# Patient Record
Sex: Male | Born: 1935
Health system: Southern US, Community
[De-identification: ages and names within clinical notes are randomized; demographics above are authoritative.]

## PROBLEM LIST (undated history)

## (undated) DIAGNOSIS — C61 Malignant neoplasm of prostate: Secondary | ICD-10-CM

## (undated) DIAGNOSIS — R0600 Dyspnea, unspecified: Secondary | ICD-10-CM

## (undated) DIAGNOSIS — L309 Dermatitis, unspecified: Secondary | ICD-10-CM

## (undated) DIAGNOSIS — Z9889 Other specified postprocedural states: Secondary | ICD-10-CM

## (undated) DIAGNOSIS — K219 Gastro-esophageal reflux disease without esophagitis: Secondary | ICD-10-CM

## (undated) DIAGNOSIS — R338 Other retention of urine: Secondary | ICD-10-CM

## (undated) DIAGNOSIS — N401 Enlarged prostate with lower urinary tract symptoms: Secondary | ICD-10-CM

## (undated) DIAGNOSIS — Z974 Presence of external hearing-aid: Secondary | ICD-10-CM

## (undated) DIAGNOSIS — J45909 Unspecified asthma, uncomplicated: Secondary | ICD-10-CM

## (undated) DIAGNOSIS — Z860101 Personal history of adenomatous and serrated colon polyps: Secondary | ICD-10-CM

## (undated) DIAGNOSIS — Z978 Presence of other specified devices: Secondary | ICD-10-CM

## (undated) DIAGNOSIS — E785 Hyperlipidemia, unspecified: Secondary | ICD-10-CM

## (undated) DIAGNOSIS — C189 Malignant neoplasm of colon, unspecified: Secondary | ICD-10-CM

## (undated) DIAGNOSIS — Z86010 Personal history of colonic polyps: Secondary | ICD-10-CM

## (undated) DIAGNOSIS — Z85820 Personal history of malignant melanoma of skin: Secondary | ICD-10-CM

## (undated) DIAGNOSIS — J449 Chronic obstructive pulmonary disease, unspecified: Secondary | ICD-10-CM

## (undated) DIAGNOSIS — K5730 Diverticulosis of large intestine without perforation or abscess without bleeding: Secondary | ICD-10-CM

## (undated) DIAGNOSIS — C4490 Unspecified malignant neoplasm of skin, unspecified: Secondary | ICD-10-CM

## (undated) DIAGNOSIS — Z960 Presence of urogenital implants: Secondary | ICD-10-CM

## (undated) DIAGNOSIS — Z85048 Personal history of other malignant neoplasm of rectum, rectosigmoid junction, and anus: Secondary | ICD-10-CM

## (undated) DIAGNOSIS — Z87442 Personal history of urinary calculi: Secondary | ICD-10-CM

## (undated) HISTORY — PX: COLONOSCOPY: SHX174

## (undated) HISTORY — DX: Unspecified asthma, uncomplicated: J45.909

## (undated) HISTORY — PX: EYE SURGERY: SHX253

---

## 2003-10-10 ENCOUNTER — Emergency Department (HOSPITAL_COMMUNITY): Admission: EM | Admit: 2003-10-10 | Discharge: 2003-10-10 | Payer: Self-pay | Admitting: Emergency Medicine

## 2003-11-15 ENCOUNTER — Ambulatory Visit: Payer: Self-pay | Admitting: Internal Medicine

## 2003-11-22 ENCOUNTER — Ambulatory Visit: Payer: Self-pay | Admitting: Internal Medicine

## 2003-11-28 ENCOUNTER — Ambulatory Visit: Payer: Self-pay | Admitting: Internal Medicine

## 2003-12-21 ENCOUNTER — Ambulatory Visit (HOSPITAL_COMMUNITY): Admission: RE | Admit: 2003-12-21 | Discharge: 2003-12-21 | Payer: Self-pay | Admitting: General Surgery

## 2003-12-23 ENCOUNTER — Ambulatory Visit: Payer: Self-pay | Admitting: Oncology

## 2004-01-05 ENCOUNTER — Ambulatory Visit (HOSPITAL_COMMUNITY): Admission: RE | Admit: 2004-01-05 | Discharge: 2004-01-05 | Payer: Self-pay | Admitting: Hematology and Oncology

## 2004-01-06 ENCOUNTER — Ambulatory Visit (HOSPITAL_COMMUNITY): Admission: RE | Admit: 2004-01-06 | Discharge: 2004-01-06 | Payer: Self-pay | Admitting: Hematology and Oncology

## 2004-01-06 ENCOUNTER — Encounter (INDEPENDENT_AMBULATORY_CARE_PROVIDER_SITE_OTHER): Payer: Self-pay | Admitting: *Deleted

## 2004-01-24 ENCOUNTER — Encounter (INDEPENDENT_AMBULATORY_CARE_PROVIDER_SITE_OTHER): Payer: Self-pay | Admitting: Specialist

## 2004-01-24 ENCOUNTER — Observation Stay (HOSPITAL_COMMUNITY): Admission: RE | Admit: 2004-01-24 | Discharge: 2004-01-25 | Payer: Self-pay | Admitting: General Surgery

## 2004-01-24 HISTORY — PX: OTHER SURGICAL HISTORY: SHX169

## 2004-02-13 ENCOUNTER — Ambulatory Visit: Payer: Self-pay | Admitting: Oncology

## 2004-04-26 ENCOUNTER — Ambulatory Visit (HOSPITAL_COMMUNITY): Admission: RE | Admit: 2004-04-26 | Discharge: 2004-04-26 | Payer: Self-pay | Admitting: Oncology

## 2004-05-02 ENCOUNTER — Ambulatory Visit: Payer: Self-pay | Admitting: Oncology

## 2004-06-15 ENCOUNTER — Ambulatory Visit: Payer: Self-pay | Admitting: Internal Medicine

## 2004-07-03 ENCOUNTER — Encounter (INDEPENDENT_AMBULATORY_CARE_PROVIDER_SITE_OTHER): Payer: Self-pay | Admitting: Specialist

## 2004-07-03 ENCOUNTER — Ambulatory Visit: Payer: Self-pay | Admitting: Internal Medicine

## 2004-10-26 ENCOUNTER — Emergency Department (HOSPITAL_COMMUNITY): Admission: EM | Admit: 2004-10-26 | Discharge: 2004-10-27 | Payer: Self-pay | Admitting: Emergency Medicine

## 2005-10-31 ENCOUNTER — Ambulatory Visit: Payer: Self-pay | Admitting: Internal Medicine

## 2005-11-14 ENCOUNTER — Ambulatory Visit: Payer: Self-pay | Admitting: Internal Medicine

## 2005-11-14 ENCOUNTER — Encounter (INDEPENDENT_AMBULATORY_CARE_PROVIDER_SITE_OTHER): Payer: Self-pay | Admitting: *Deleted

## 2008-10-20 ENCOUNTER — Encounter (INDEPENDENT_AMBULATORY_CARE_PROVIDER_SITE_OTHER): Payer: Self-pay | Admitting: *Deleted

## 2009-05-15 ENCOUNTER — Telehealth: Payer: Self-pay | Admitting: Internal Medicine

## 2009-06-09 ENCOUNTER — Encounter (INDEPENDENT_AMBULATORY_CARE_PROVIDER_SITE_OTHER): Payer: Self-pay | Admitting: *Deleted

## 2009-06-15 ENCOUNTER — Encounter (INDEPENDENT_AMBULATORY_CARE_PROVIDER_SITE_OTHER): Payer: Self-pay

## 2009-06-20 ENCOUNTER — Ambulatory Visit: Payer: Self-pay | Admitting: Internal Medicine

## 2009-07-06 ENCOUNTER — Ambulatory Visit: Payer: Self-pay | Admitting: Internal Medicine

## 2010-01-27 ENCOUNTER — Encounter: Payer: Self-pay | Admitting: General Surgery

## 2010-02-08 NOTE — Miscellaneous (Signed)
Summary: Lec previsit  Clinical Lists Changes  Medications: Added new medication of MOVIPREP 100 GM  SOLR (PEG-KCL-NACL-NASULF-NA ASC-C) As per prep instructions. - Signed Rx of MOVIPREP 100 GM  SOLR (PEG-KCL-NACL-NASULF-NA ASC-C) As per prep instructions.;  #1 x 0;  Signed;  Entered by: [name] RN;  Authorized by: Hilarie Fredrickson MD;  Method used: Electronically to [facility]*, [address] [place], [place]  [id number], Ph: [phone], Fax: [phone] Allergies: Added new allergy or adverse reaction of * SULFUR Observations: Added new observation of NKA: F (06/20/2009 9:56)    Prescriptions: MOVIPREP 100 GM  SOLR (PEG-KCL-NACL-NASULF-NA ASC-C) As per prep instructions.  #1 x 0   Entered by:   [name] RN   Authorized by:   Hilarie Fredrickson MD   Signed by:   [name] RN on 06/20/2009   Method used:   Electronically to        [facility]* (retail)       [address]       [place], [place]  [id number]       Ph: [phone]       Fax: [phone]   RxID:   [id number]

## 2010-02-08 NOTE — Progress Notes (Signed)
Summary: Schedule Colonoscopy  Phone Note Outgoing Call Call back at Home Phone [phone]   Call placed by: Harlow Mares CMA Duncan Dull),  May 15, 2009 4:18 PM Call placed to: Patient Summary of Call: left message with patients wife, she will have him call back to schedule his colonoscopy. Initial call taken by: Harlow Mares CMA Duncan Dull),  May 15, 2009 4:19 PM  Follow-up for Phone Call        patient scheduled for 07/06/2009 colonoscopy Follow-up by: Harlow Mares CMA (AAMA),  June 08, 2009 3:05 PM

## 2010-02-08 NOTE — Letter (Signed)
Summary: [facility] Instructions  [facility]  [address] [place], [place] [address]   Phone: [phone]  Fax: [phone]       Charles Mitchell    August 16, 1931    MRN: [id number]        Procedure Day Charles Mitchell:  Charles Mitchell  07/06/09     Arrival Time:  8:00AM     Procedure Time:  9:00AM     Location of Procedure:                    Charles Mitchell _  [facility] (4th Floor)                       PREPARATION FOR COLONOSCOPY WITH MOVIPREP   Starting 5 days prior to your procedure 07/01/09 do not eat nuts, seeds, popcorn, corn, beans, peas,  salads, or any raw vegetables.  Do not take any fiber supplements (e.g. Metamucil, Citrucel, and Benefiber).  THE DAY BEFORE YOUR PROCEDURE         DATE: 07/05/09  DAY: WEDNESDAY  1.  Drink clear liquids the entire day-NO SOLID FOOD  2.  Do not drink anything colored red or purple.  Avoid juices with pulp.  No orange juice.  3.  Drink at least 64 oz. (8 glasses) of fluid/clear liquids during the day to prevent dehydration and help the prep work efficiently.  CLEAR LIQUIDS INCLUDE: Water Jello Ice Popsicles Tea (sugar ok, no milk/cream) Powdered fruit flavored drinks Coffee (sugar ok, no milk/cream) Gatorade Juice: apple, white grape, white cranberry  Lemonade Clear bullion, consomm, broth Carbonated beverages (any kind) Strained chicken noodle soup Hard Candy                             4.  In the morning, mix first dose of MoviPrep solution:    Empty 1 Pouch A and 1 Pouch B into the disposable container    Add lukewarm drinking water to the top line of the container. Mix to dissolve    Refrigerate (mixed solution should be used within 24 hrs)  5.  Begin drinking the prep at 5:00 p.m. The MoviPrep container is divided by 4 marks.   Every 15 minutes drink the solution down to the next mark (approximately 8 oz) until the full liter is complete.   6.  Follow completed prep with 16 oz of clear liquid of your choice  (Nothing red or purple).  Continue to drink clear liquids until bedtime.  7.  Before going to bed, mix second dose of MoviPrep solution:    Empty 1 Pouch A and 1 Pouch B into the disposable container    Add lukewarm drinking water to the top line of the container. Mix to dissolve    Refrigerate  THE DAY OF YOUR PROCEDURE      DATE: 07/06/09  DAY: THURSDAY  Beginning at 4:00AM (5 hours before procedure):         1. Every 15 minutes, drink the solution down to the next mark (approx 8 oz) until the full liter is complete.  2. Follow completed prep with 16 oz. of clear liquid of your choice.    3. You may drink clear liquids until 7:00AM (2 HOURS BEFORE PROCEDURE).   MEDICATION INSTRUCTIONS  Unless otherwise instructed, you should take regular prescription medications with a small sip of water   as early as possible the morning of  your procedure.         OTHER INSTRUCTIONS  You will need a responsible adult at least 75 years of age to accompany you and drive you home.   This person must remain in the waiting room during your procedure.  Wear loose fitting clothing that is easily removed.  Leave jewelry and other valuables at home.  However, you may wish to bring a book to read or  an iPod/MP3 player to listen to music as you wait for your procedure to start.  Remove all body piercing jewelry and leave at home.  Total time from sign-in until discharge is approximately 2-3 hours.  You should go home directly after your procedure and rest.  You can resume normal activities the  day after your procedure.  The day of your procedure you should not:   Drive   Make legal decisions   Operate machinery   Drink alcohol   Return to work  You will receive specific instructions about eating, activities and medications before you leave.    The above instructions have been reviewed and explained to me by   Charles Rias RN  June 20, 2009 10:14 AM     I fully understand  and can verbalize these instructions _____________________________ Date _________

## 2010-02-08 NOTE — Procedures (Signed)
Summary: Colonoscopy  Patient: Jemar Paulsen Note: All result statuses are Final unless otherwise noted.  Tests: (1) Colonoscopy (COL)   COL Colonoscopy           DONE     [facility]     [address]     [place], [place]  [address]           COLONOSCOPY PROCEDURE REPORT           PATIENT:  Janssen, Zee  MR#:  [id number]     BIRTHDATE:  1931/06/27, 77 yrs. old  GENDER:  male     ENDOSCOPIST:  Wilhemina Bonito. Eda Keys, MD     REF. BY:  Surveillance Program Recall,     PROCEDURE DATE:  07/06/2009     PROCEDURE:  Surveillance Colonoscopy, Higher-risk screening     colonoscopy G0105           ASA CLASS:  Class II     INDICATIONS:  history of colon cancer ; rectal Ca 2005; f/u 2006,     2007     MEDICATIONS:   Fentanyl 50 mcg IV, Versed 7 mg IV           DESCRIPTION OF PROCEDURE:   After the risks benefits and     alternatives of the procedure were thoroughly explained, informed     consent was obtained.  Digital rectal exam was performed and     revealed no abnormalities.   The [id number] [id number] endoscope was     introduced through the anus and advanced to the cecum, which was     identified by both the appendix and ileocecal valve, without     limitations.Time to cecum = 4:49 min. The quality of the prep was     excellent, using MoviPrep.  The instrument was then slowly     withdrawn (time = 10:05 min) as the colon was fully examined.     <<PROCEDUREIMAGES>>           FINDINGS:  Severe diverticulosis was found throughout the colon.     A 2cm lipoma was found in the proximal transverse colon.  This was     otherwise a normal examination of the colon.   Retroflexed views     in the rectum revealed no abnormalities (scar from prior excision     present) .    The scope was then withdrawn from the patient and     the procedure completed.           COMPLICATIONS:  None     ENDOSCOPIC IMPRESSION:     1) Severe diverticulosis throughout the colon     2) Lipoma in the proximal  transverse colon     3) Otherwise normal examination     RECOMMENDATIONS:     1) Follow up colonoscopy in 5 years           ______________________________     Wilhemina Bonito. Eda Keys, MD           CC:  Rodrigo Ran, MD;Todd Abbey Chatters, MD;Bradley Truett Perna, MD;The     Patient           n.     Rosalie DoctorWilhemina Bonito. Eda Keys at 07/06/2009 10:00 AM           Maylene Roes, [id number]  Note: An exclamation mark (!) indicates a result that was not dispersed into the flowsheet. Document Creation Date: 07/06/2009 10:01 AM _______________________________________________________________________  (1) Order  result status: Final Collection or observation date-time: 07/06/2009 09:51 Requested date-time:  Receipt date-time:  Reported date-time:  Referring Physician:   Ordering Physician: Fransico Setters [phone]) Specimen Source:  Source: Launa Grill Order Number: [id number] Lab site:   Appended Document: Colonoscopy    Clinical Lists Changes  Observations: Added new observation of COLONNXTDUE: 06/2014 (07/06/2009 11:16)

## 2010-02-08 NOTE — Letter (Signed)
Summary: Previsit letter  [facility]  [address] [place], [place] [address]   Phone: [phone]  Fax: [phone]       06/09/2009 MRN: [id number]  [facility] [address] [place], [place]  [address]  Dear Mr. DESIRE,  Welcome to the Gastroenterology Division at [facility].    You are scheduled to see a nurse for your pre-procedure visit on 06/20/2009 at 10:00AM on the 3rd floor at [facility], [address]  We ask that you try to arrive at our office 15 minutes prior to your appointment time to allow for check-in.  Your nurse visit will consist of discussing your medical and surgical history, your immediate family medical history, and your medications.    Please bring a complete list of all your medications or, if you prefer, bring the medication bottles and we will list them.  We will need to be aware of both prescribed and over the counter drugs.  We will need to know exact dosage information as well.  If you are on blood thinners (Coumadin, Plavix, Aggrenox, Ticlid, etc.) please call our office today/prior to your appointment, as we need to consult with your physician about holding your medication.   Please be prepared to read and sign documents such as consent forms, a financial agreement, and acknowledgement forms.  If necessary, and with your consent, a friend or relative is welcome to sit-in on the nurse visit with you.  Please bring your insurance card so that we may make a copy of it.  If your insurance requires a referral to see a specialist, please bring your referral form from your primary care physician.  No co-pay is required for this nurse visit.     If you cannot keep your appointment, please call [phone] to cancel or reschedule prior to your appointment date.  This allows [place] the opportunity to schedule an appointment for another patient in need of care.    Thank you for choosing [facility] for your medical  needs.  We appreciate the opportunity to care for you.  Please visit [place] at our website  to learn more about our practice.                     Sincerely.                                                                                                                   The Gastroenterology Division

## 2010-05-25 NOTE — Op Note (Signed)
NAME:  Charles, Mitchell NO.:  [id number]   MEDICAL RECORD NO.:  [id number]          PATIENT TYPE:  AMB   LOCATION:  DAY                          FACILITY:  [facility]   PHYSICIAN:  Adolph Pollack, M.D.DATE OF BIRTH:  Aug 19, 1931   DATE OF PROCEDURE:  01/24/2004  DATE OF DISCHARGE:                                 OPERATIVE REPORT   PREOPERATIVE DIAGNOSES:  1.  Rectal cancer.  2.  Hemorrhoids.   POSTOPERATIVE DIAGNOSES:  1.  Rectal cancer.  2.  Hemorrhoids.   PROCEDURE:  1.  Transrectal ultrasound.  2.  Transanal excision of rectal cancer.  3.  Hemorrhoidectomy (left lateral, right posterolateral).   SURGEON:  Adolph Pollack, M.D.   ANESTHESIA:  General.   INDICATIONS FOR PROCEDURE:  Mr. Prashant is a 77 year old male undergoing a  colonoscopy by Dr. Marina Goodell.  He had a 30-cm sessile rectal polyp removed in  piecemeal fashion.  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  I saw him in the office back in early  December.  A CT scan was ordered, and it suggested that he had metastatic  disease to the liver.  Referral was made to the [facility].  PET  scan actually was negative for showing these lesions to be hyperactive.  A  biopsy was performed which was consistent with hemangioma.  He now presents  for the above procedure.   DESCRIPTION OF PROCEDURE:  He was seen in the holding area and then brought  to the operating room, and a general anesthetic was administered.  He was  then placed prone on the operating table.   A jackknife position was ensued.  The buttocks were retracted with tape, and  two prolapsed internal hemorrhoids were noted in the right posterolateral  and left lateral position.  Following this, a digital rectal exam was  performed.  An irregularity in the right anterolateral position was noted,  consistent with what was  seen and found in the office.  Transrectal  ultrasound was then performed.  There did not appear to be any residual  lesion.  Based on the transrectal ultrasound, it appeared to be a mucosal-  based lesion, thus T1.  No lymphadenopathy or enlarged lymph nodes were  noted.  Thus, I decided to proceed with the transanal excision.   An anal retractor was used.  In the 4 and 5 position, that would be in the  right anterolateral, some gross abnormality of the area was identified.  This was consistent with the previous polypectomy site.  I then performed a  full-thickness excision from the 2 to the 6 o'clock region with  electrocautery.  I also obtained a proximal margin, full-thickness, and sent  these two specimens together, with the proximal margin being marked with a  suture.  Following this, I controlled some bleeding with the cautery.  I  then reapproximated most of the mucosa with interrupted Vicryl sutures.   The proctoscope was introduced, and the rectum was patent.  Hemostasis  appeared to be adequate at this time.   Following this, I approached the left lateral hemorrhoid and injected some  0.5% Marcaine with epinephrine.  The vascular pedicle was ligated with a 2-0  chromic suture, and the hemorrhoid was excised sharply.  The anoderm and  anal mucosa were approximated with running locking 2-0 chromic suture.   In a similar fashion, the right posterolateral hemorrhoid was approached and  local anesthetic injected.  The vascular pedicle was ligated with a 2-0  chromic suture.  The hemorrhoid was then excised sharply, and the anoderm  and anal mucosa were approximated with running locking 2-0 chromic suture.   I then inspected the rectal wound, and it was hemostatic.  A piece of  Gelfoam was placed in the rectal vault.  A bulky dressing was then applied.   The patient was returned supine, extubated, and taken to the recovery room  in satisfactory condition.  There were no apparent  complications.      TJR/MEDQ  D:  01/24/2004  T:  01/24/2004  Job:  [id number]   cc:   Loraine Leriche A. Waynard Edwards, M.D.  [address]  [place]  [place] [address]  Fax: [phone]   Wilhemina Bonito. Marina Goodell, M.D. [facility]   Jillyn Hidden B. Truett Perna, M.D.  [address] [facility]  [place]  [place] [address]  Fax: [phone]

## 2011-01-24 DIAGNOSIS — C44319 Basal cell carcinoma of skin of other parts of face: Secondary | ICD-10-CM | POA: Diagnosis not present

## 2011-02-27 DIAGNOSIS — L259 Unspecified contact dermatitis, unspecified cause: Secondary | ICD-10-CM | POA: Diagnosis not present

## 2011-04-15 DIAGNOSIS — R7301 Impaired fasting glucose: Secondary | ICD-10-CM | POA: Diagnosis not present

## 2011-04-15 DIAGNOSIS — Z87442 Personal history of urinary calculi: Secondary | ICD-10-CM | POA: Diagnosis not present

## 2011-04-15 DIAGNOSIS — E785 Hyperlipidemia, unspecified: Secondary | ICD-10-CM | POA: Diagnosis not present

## 2011-04-15 DIAGNOSIS — Z125 Encounter for screening for malignant neoplasm of prostate: Secondary | ICD-10-CM | POA: Diagnosis not present

## 2011-04-22 DIAGNOSIS — D7589 Other specified diseases of blood and blood-forming organs: Secondary | ICD-10-CM | POA: Diagnosis not present

## 2011-04-22 DIAGNOSIS — C20 Malignant neoplasm of rectum: Secondary | ICD-10-CM | POA: Diagnosis not present

## 2011-04-22 DIAGNOSIS — Z125 Encounter for screening for malignant neoplasm of prostate: Secondary | ICD-10-CM | POA: Diagnosis not present

## 2011-04-22 DIAGNOSIS — R7301 Impaired fasting glucose: Secondary | ICD-10-CM | POA: Diagnosis not present

## 2011-04-22 DIAGNOSIS — D510 Vitamin B12 deficiency anemia due to intrinsic factor deficiency: Secondary | ICD-10-CM | POA: Diagnosis not present

## 2011-04-22 DIAGNOSIS — E785 Hyperlipidemia, unspecified: Secondary | ICD-10-CM | POA: Diagnosis not present

## 2011-04-22 DIAGNOSIS — Z0000 Encounter for general adult medical examination without abnormal findings: Secondary | ICD-10-CM | POA: Diagnosis not present

## 2011-05-07 DIAGNOSIS — D510 Vitamin B12 deficiency anemia due to intrinsic factor deficiency: Secondary | ICD-10-CM | POA: Diagnosis not present

## 2011-05-14 DIAGNOSIS — D510 Vitamin B12 deficiency anemia due to intrinsic factor deficiency: Secondary | ICD-10-CM | POA: Diagnosis not present

## 2011-05-21 DIAGNOSIS — D510 Vitamin B12 deficiency anemia due to intrinsic factor deficiency: Secondary | ICD-10-CM | POA: Diagnosis not present

## 2011-06-20 DIAGNOSIS — D510 Vitamin B12 deficiency anemia due to intrinsic factor deficiency: Secondary | ICD-10-CM | POA: Diagnosis not present

## 2011-07-23 DIAGNOSIS — D510 Vitamin B12 deficiency anemia due to intrinsic factor deficiency: Secondary | ICD-10-CM | POA: Diagnosis not present

## 2011-08-27 DIAGNOSIS — D510 Vitamin B12 deficiency anemia due to intrinsic factor deficiency: Secondary | ICD-10-CM | POA: Diagnosis not present

## 2011-09-26 DIAGNOSIS — Z23 Encounter for immunization: Secondary | ICD-10-CM | POA: Diagnosis not present

## 2011-09-26 DIAGNOSIS — D510 Vitamin B12 deficiency anemia due to intrinsic factor deficiency: Secondary | ICD-10-CM | POA: Diagnosis not present

## 2011-10-14 DIAGNOSIS — Z23 Encounter for immunization: Secondary | ICD-10-CM | POA: Diagnosis not present

## 2012-02-18 DIAGNOSIS — D510 Vitamin B12 deficiency anemia due to intrinsic factor deficiency: Secondary | ICD-10-CM | POA: Diagnosis not present

## 2012-02-18 DIAGNOSIS — R05 Cough: Secondary | ICD-10-CM | POA: Diagnosis not present

## 2012-02-18 DIAGNOSIS — D539 Nutritional anemia, unspecified: Secondary | ICD-10-CM | POA: Diagnosis not present

## 2012-02-18 DIAGNOSIS — K219 Gastro-esophageal reflux disease without esophagitis: Secondary | ICD-10-CM | POA: Diagnosis not present

## 2012-02-25 DIAGNOSIS — K7689 Other specified diseases of liver: Secondary | ICD-10-CM | POA: Diagnosis not present

## 2012-02-25 DIAGNOSIS — R1013 Epigastric pain: Secondary | ICD-10-CM | POA: Diagnosis not present

## 2012-02-25 DIAGNOSIS — R109 Unspecified abdominal pain: Secondary | ICD-10-CM | POA: Diagnosis not present

## 2012-02-25 DIAGNOSIS — Q6101 Congenital single renal cyst: Secondary | ICD-10-CM | POA: Diagnosis not present

## 2012-02-26 ENCOUNTER — Other Ambulatory Visit: Payer: Self-pay | Admitting: Internal Medicine

## 2012-02-26 DIAGNOSIS — K769 Liver disease, unspecified: Secondary | ICD-10-CM

## 2012-02-26 DIAGNOSIS — N281 Cyst of kidney, acquired: Secondary | ICD-10-CM

## 2012-02-27 ENCOUNTER — Ambulatory Visit
Admission: RE | Admit: 2012-02-27 | Discharge: 2012-02-27 | Disposition: A | Discharge status: Home / Self Care | Payer: Medicare Other | Source: Ambulatory Visit | Attending: Internal Medicine | Admitting: Internal Medicine

## 2012-02-27 DIAGNOSIS — D1803 Hemangioma of intra-abdominal structures: Secondary | ICD-10-CM | POA: Diagnosis not present

## 2012-02-27 DIAGNOSIS — K769 Liver disease, unspecified: Secondary | ICD-10-CM

## 2012-02-27 DIAGNOSIS — K5730 Diverticulosis of large intestine without perforation or abscess without bleeding: Secondary | ICD-10-CM | POA: Diagnosis not present

## 2012-02-27 DIAGNOSIS — N281 Cyst of kidney, acquired: Secondary | ICD-10-CM | POA: Diagnosis not present

## 2012-02-27 MED ORDER — IOHEXOL 300 MG/ML  SOLN
100.0000 mL | Freq: Once | INTRAMUSCULAR | Status: AC | PRN
  Administered 2012-02-27: 100 mL via INTRAVENOUS

## 2012-06-12 DIAGNOSIS — R7301 Impaired fasting glucose: Secondary | ICD-10-CM | POA: Diagnosis not present

## 2012-06-12 DIAGNOSIS — E785 Hyperlipidemia, unspecified: Secondary | ICD-10-CM | POA: Diagnosis not present

## 2012-06-12 DIAGNOSIS — Z125 Encounter for screening for malignant neoplasm of prostate: Secondary | ICD-10-CM | POA: Diagnosis not present

## 2012-06-19 DIAGNOSIS — H902 Conductive hearing loss, unspecified: Secondary | ICD-10-CM | POA: Diagnosis not present

## 2012-06-19 DIAGNOSIS — Z125 Encounter for screening for malignant neoplasm of prostate: Secondary | ICD-10-CM | POA: Diagnosis not present

## 2012-06-19 DIAGNOSIS — E785 Hyperlipidemia, unspecified: Secondary | ICD-10-CM | POA: Diagnosis not present

## 2012-06-19 DIAGNOSIS — Z0000 Encounter for general adult medical examination without abnormal findings: Secondary | ICD-10-CM | POA: Diagnosis not present

## 2012-06-19 DIAGNOSIS — R7301 Impaired fasting glucose: Secondary | ICD-10-CM | POA: Diagnosis not present

## 2012-06-19 DIAGNOSIS — D510 Vitamin B12 deficiency anemia due to intrinsic factor deficiency: Secondary | ICD-10-CM | POA: Diagnosis not present

## 2012-06-19 DIAGNOSIS — K219 Gastro-esophageal reflux disease without esophagitis: Secondary | ICD-10-CM | POA: Diagnosis not present

## 2012-06-30 ENCOUNTER — Other Ambulatory Visit: Payer: Self-pay | Admitting: Dermatology

## 2012-06-30 DIAGNOSIS — L821 Other seborrheic keratosis: Secondary | ICD-10-CM | POA: Diagnosis not present

## 2012-06-30 DIAGNOSIS — C4359 Malignant melanoma of other part of trunk: Secondary | ICD-10-CM | POA: Diagnosis not present

## 2012-06-30 DIAGNOSIS — L819 Disorder of pigmentation, unspecified: Secondary | ICD-10-CM | POA: Diagnosis not present

## 2012-06-30 DIAGNOSIS — D485 Neoplasm of uncertain behavior of skin: Secondary | ICD-10-CM | POA: Diagnosis not present

## 2012-06-30 DIAGNOSIS — Z85828 Personal history of other malignant neoplasm of skin: Secondary | ICD-10-CM | POA: Diagnosis not present

## 2012-06-30 DIAGNOSIS — D239 Other benign neoplasm of skin, unspecified: Secondary | ICD-10-CM | POA: Diagnosis not present

## 2012-06-30 DIAGNOSIS — L259 Unspecified contact dermatitis, unspecified cause: Secondary | ICD-10-CM | POA: Diagnosis not present

## 2012-07-23 ENCOUNTER — Other Ambulatory Visit: Payer: Self-pay | Admitting: Dermatology

## 2012-07-23 DIAGNOSIS — C4359 Malignant melanoma of other part of trunk: Secondary | ICD-10-CM | POA: Diagnosis not present

## 2012-07-23 DIAGNOSIS — Z85828 Personal history of other malignant neoplasm of skin: Secondary | ICD-10-CM | POA: Diagnosis not present

## 2012-07-23 DIAGNOSIS — Z85820 Personal history of malignant melanoma of skin: Secondary | ICD-10-CM | POA: Diagnosis not present

## 2012-07-28 DIAGNOSIS — H903 Sensorineural hearing loss, bilateral: Secondary | ICD-10-CM | POA: Diagnosis not present

## 2012-08-13 DIAGNOSIS — H903 Sensorineural hearing loss, bilateral: Secondary | ICD-10-CM | POA: Diagnosis not present

## 2012-10-22 DIAGNOSIS — Z85828 Personal history of other malignant neoplasm of skin: Secondary | ICD-10-CM | POA: Diagnosis not present

## 2012-10-22 DIAGNOSIS — L819 Disorder of pigmentation, unspecified: Secondary | ICD-10-CM | POA: Diagnosis not present

## 2012-10-22 DIAGNOSIS — L821 Other seborrheic keratosis: Secondary | ICD-10-CM | POA: Diagnosis not present

## 2012-10-22 DIAGNOSIS — D239 Other benign neoplasm of skin, unspecified: Secondary | ICD-10-CM | POA: Diagnosis not present

## 2012-10-22 DIAGNOSIS — Z85820 Personal history of malignant melanoma of skin: Secondary | ICD-10-CM | POA: Diagnosis not present

## 2012-10-22 DIAGNOSIS — D1801 Hemangioma of skin and subcutaneous tissue: Secondary | ICD-10-CM | POA: Diagnosis not present

## 2013-01-21 DIAGNOSIS — D1801 Hemangioma of skin and subcutaneous tissue: Secondary | ICD-10-CM | POA: Diagnosis not present

## 2013-01-21 DIAGNOSIS — L259 Unspecified contact dermatitis, unspecified cause: Secondary | ICD-10-CM | POA: Diagnosis not present

## 2013-01-21 DIAGNOSIS — Z85820 Personal history of malignant melanoma of skin: Secondary | ICD-10-CM | POA: Diagnosis not present

## 2013-01-21 DIAGNOSIS — Z85828 Personal history of other malignant neoplasm of skin: Secondary | ICD-10-CM | POA: Diagnosis not present

## 2013-01-21 DIAGNOSIS — L821 Other seborrheic keratosis: Secondary | ICD-10-CM | POA: Diagnosis not present

## 2013-04-22 DIAGNOSIS — L821 Other seborrheic keratosis: Secondary | ICD-10-CM | POA: Diagnosis not present

## 2013-04-22 DIAGNOSIS — Z85828 Personal history of other malignant neoplasm of skin: Secondary | ICD-10-CM | POA: Diagnosis not present

## 2013-04-22 DIAGNOSIS — L259 Unspecified contact dermatitis, unspecified cause: Secondary | ICD-10-CM | POA: Diagnosis not present

## 2013-04-22 DIAGNOSIS — Z85820 Personal history of malignant melanoma of skin: Secondary | ICD-10-CM | POA: Diagnosis not present

## 2013-07-15 DIAGNOSIS — L259 Unspecified contact dermatitis, unspecified cause: Secondary | ICD-10-CM | POA: Diagnosis not present

## 2013-07-15 DIAGNOSIS — Z85828 Personal history of other malignant neoplasm of skin: Secondary | ICD-10-CM | POA: Diagnosis not present

## 2013-07-15 DIAGNOSIS — L84 Corns and callosities: Secondary | ICD-10-CM | POA: Diagnosis not present

## 2013-07-15 DIAGNOSIS — L2089 Other atopic dermatitis: Secondary | ICD-10-CM | POA: Diagnosis not present

## 2013-07-15 DIAGNOSIS — L821 Other seborrheic keratosis: Secondary | ICD-10-CM | POA: Diagnosis not present

## 2013-07-15 DIAGNOSIS — L819 Disorder of pigmentation, unspecified: Secondary | ICD-10-CM | POA: Diagnosis not present

## 2013-07-15 DIAGNOSIS — D1801 Hemangioma of skin and subcutaneous tissue: Secondary | ICD-10-CM | POA: Diagnosis not present

## 2013-07-15 DIAGNOSIS — Z85820 Personal history of malignant melanoma of skin: Secondary | ICD-10-CM | POA: Diagnosis not present

## 2013-07-26 DIAGNOSIS — E785 Hyperlipidemia, unspecified: Secondary | ICD-10-CM | POA: Diagnosis not present

## 2013-07-26 DIAGNOSIS — D510 Vitamin B12 deficiency anemia due to intrinsic factor deficiency: Secondary | ICD-10-CM | POA: Diagnosis not present

## 2013-07-26 DIAGNOSIS — D539 Nutritional anemia, unspecified: Secondary | ICD-10-CM | POA: Diagnosis not present

## 2013-07-26 DIAGNOSIS — Z125 Encounter for screening for malignant neoplasm of prostate: Secondary | ICD-10-CM | POA: Diagnosis not present

## 2013-07-26 DIAGNOSIS — R7301 Impaired fasting glucose: Secondary | ICD-10-CM | POA: Diagnosis not present

## 2013-07-26 DIAGNOSIS — Z79899 Other long term (current) drug therapy: Secondary | ICD-10-CM | POA: Diagnosis not present

## 2013-08-02 DIAGNOSIS — R252 Cramp and spasm: Secondary | ICD-10-CM | POA: Diagnosis not present

## 2013-08-02 DIAGNOSIS — E785 Hyperlipidemia, unspecified: Secondary | ICD-10-CM | POA: Diagnosis not present

## 2013-08-02 DIAGNOSIS — D510 Vitamin B12 deficiency anemia due to intrinsic factor deficiency: Secondary | ICD-10-CM | POA: Diagnosis not present

## 2013-08-02 DIAGNOSIS — Z87442 Personal history of urinary calculi: Secondary | ICD-10-CM | POA: Diagnosis not present

## 2013-08-02 DIAGNOSIS — L2089 Other atopic dermatitis: Secondary | ICD-10-CM | POA: Diagnosis not present

## 2013-08-02 DIAGNOSIS — Z1331 Encounter for screening for depression: Secondary | ICD-10-CM | POA: Diagnosis not present

## 2013-08-02 DIAGNOSIS — Z23 Encounter for immunization: Secondary | ICD-10-CM | POA: Diagnosis not present

## 2013-08-02 DIAGNOSIS — Z0000 Encounter for general adult medical examination without abnormal findings: Secondary | ICD-10-CM | POA: Diagnosis not present

## 2013-08-02 DIAGNOSIS — R7301 Impaired fasting glucose: Secondary | ICD-10-CM | POA: Diagnosis not present

## 2013-08-03 DIAGNOSIS — Z1212 Encounter for screening for malignant neoplasm of rectum: Secondary | ICD-10-CM | POA: Diagnosis not present

## 2013-08-30 DIAGNOSIS — H2510 Age-related nuclear cataract, unspecified eye: Secondary | ICD-10-CM | POA: Diagnosis not present

## 2013-09-01 DIAGNOSIS — Z13820 Encounter for screening for osteoporosis: Secondary | ICD-10-CM | POA: Diagnosis not present

## 2013-09-24 ENCOUNTER — Encounter: Payer: Self-pay | Admitting: Internal Medicine

## 2013-10-26 DIAGNOSIS — M810 Age-related osteoporosis without current pathological fracture: Secondary | ICD-10-CM | POA: Diagnosis not present

## 2013-10-26 DIAGNOSIS — Z23 Encounter for immunization: Secondary | ICD-10-CM | POA: Diagnosis not present

## 2013-10-26 DIAGNOSIS — Z6822 Body mass index (BMI) 22.0-22.9, adult: Secondary | ICD-10-CM | POA: Diagnosis not present

## 2014-01-18 ENCOUNTER — Other Ambulatory Visit: Payer: Self-pay | Admitting: Dermatology

## 2014-01-18 DIAGNOSIS — L308 Other specified dermatitis: Secondary | ICD-10-CM | POA: Diagnosis not present

## 2014-01-18 DIAGNOSIS — Z85820 Personal history of malignant melanoma of skin: Secondary | ICD-10-CM | POA: Diagnosis not present

## 2014-01-18 DIAGNOSIS — D485 Neoplasm of uncertain behavior of skin: Secondary | ICD-10-CM | POA: Diagnosis not present

## 2014-01-18 DIAGNOSIS — D1801 Hemangioma of skin and subcutaneous tissue: Secondary | ICD-10-CM | POA: Diagnosis not present

## 2014-01-18 DIAGNOSIS — L812 Freckles: Secondary | ICD-10-CM | POA: Diagnosis not present

## 2014-01-18 DIAGNOSIS — Z85828 Personal history of other malignant neoplasm of skin: Secondary | ICD-10-CM | POA: Diagnosis not present

## 2014-01-18 DIAGNOSIS — L821 Other seborrheic keratosis: Secondary | ICD-10-CM | POA: Diagnosis not present

## 2014-01-18 DIAGNOSIS — D225 Melanocytic nevi of trunk: Secondary | ICD-10-CM | POA: Diagnosis not present

## 2014-02-15 ENCOUNTER — Ambulatory Visit
Admission: RE | Admit: 2014-02-15 | Discharge: 2014-02-15 | Disposition: A | Discharge status: Home / Self Care | Payer: Medicare Other | Source: Ambulatory Visit | Attending: Internal Medicine | Admitting: Internal Medicine

## 2014-02-15 ENCOUNTER — Other Ambulatory Visit: Payer: Self-pay | Admitting: Internal Medicine

## 2014-02-15 DIAGNOSIS — R8290 Unspecified abnormal findings in urine: Secondary | ICD-10-CM | POA: Diagnosis not present

## 2014-02-15 DIAGNOSIS — R509 Fever, unspecified: Secondary | ICD-10-CM

## 2014-02-15 DIAGNOSIS — N400 Enlarged prostate without lower urinary tract symptoms: Secondary | ICD-10-CM | POA: Diagnosis not present

## 2014-02-15 DIAGNOSIS — K5730 Diverticulosis of large intestine without perforation or abscess without bleeding: Secondary | ICD-10-CM | POA: Diagnosis not present

## 2014-02-15 DIAGNOSIS — R300 Dysuria: Secondary | ICD-10-CM

## 2014-02-15 DIAGNOSIS — Z6821 Body mass index (BMI) 21.0-21.9, adult: Secondary | ICD-10-CM | POA: Diagnosis not present

## 2014-02-25 DIAGNOSIS — R1030 Lower abdominal pain, unspecified: Secondary | ICD-10-CM | POA: Diagnosis not present

## 2014-02-25 DIAGNOSIS — R339 Retention of urine, unspecified: Secondary | ICD-10-CM | POA: Diagnosis not present

## 2014-02-25 DIAGNOSIS — I10 Essential (primary) hypertension: Secondary | ICD-10-CM | POA: Diagnosis not present

## 2014-02-25 DIAGNOSIS — R7309 Other abnormal glucose: Secondary | ICD-10-CM | POA: Diagnosis not present

## 2014-02-25 DIAGNOSIS — D649 Anemia, unspecified: Secondary | ICD-10-CM | POA: Diagnosis not present

## 2014-02-25 DIAGNOSIS — Z87891 Personal history of nicotine dependence: Secondary | ICD-10-CM | POA: Diagnosis not present

## 2014-03-01 DIAGNOSIS — Z438 Encounter for attention to other artificial openings: Secondary | ICD-10-CM | POA: Diagnosis not present

## 2014-03-01 DIAGNOSIS — T839XXA Unspecified complication of genitourinary prosthetic device, implant and graft, initial encounter: Secondary | ICD-10-CM | POA: Diagnosis not present

## 2014-03-01 DIAGNOSIS — Z87891 Personal history of nicotine dependence: Secondary | ICD-10-CM | POA: Diagnosis not present

## 2014-03-01 DIAGNOSIS — K644 Residual hemorrhoidal skin tags: Secondary | ICD-10-CM | POA: Diagnosis not present

## 2014-03-01 DIAGNOSIS — I10 Essential (primary) hypertension: Secondary | ICD-10-CM | POA: Diagnosis not present

## 2014-03-01 DIAGNOSIS — N400 Enlarged prostate without lower urinary tract symptoms: Secondary | ICD-10-CM | POA: Diagnosis not present

## 2014-03-01 DIAGNOSIS — K648 Other hemorrhoids: Secondary | ICD-10-CM | POA: Diagnosis not present

## 2014-03-04 DIAGNOSIS — R338 Other retention of urine: Secondary | ICD-10-CM | POA: Diagnosis not present

## 2014-03-04 DIAGNOSIS — N401 Enlarged prostate with lower urinary tract symptoms: Secondary | ICD-10-CM | POA: Diagnosis not present

## 2014-03-04 DIAGNOSIS — N320 Bladder-neck obstruction: Secondary | ICD-10-CM | POA: Diagnosis not present

## 2014-03-07 DIAGNOSIS — R339 Retention of urine, unspecified: Secondary | ICD-10-CM | POA: Diagnosis not present

## 2014-03-08 ENCOUNTER — Emergency Department (HOSPITAL_COMMUNITY)
Admission: EM | Admit: 2014-03-08 | Discharge: 2014-03-08 | Disposition: A | Discharge status: Home / Self Care | Payer: Medicare Other | Attending: Emergency Medicine | Admitting: Emergency Medicine

## 2014-03-08 ENCOUNTER — Emergency Department (HOSPITAL_COMMUNITY): Payer: Medicare Other

## 2014-03-08 ENCOUNTER — Encounter (HOSPITAL_COMMUNITY): Payer: Self-pay | Admitting: Emergency Medicine

## 2014-03-08 DIAGNOSIS — Z87448 Personal history of other diseases of urinary system: Secondary | ICD-10-CM | POA: Diagnosis not present

## 2014-03-08 DIAGNOSIS — R509 Fever, unspecified: Secondary | ICD-10-CM | POA: Insufficient documentation

## 2014-03-08 DIAGNOSIS — I10 Essential (primary) hypertension: Secondary | ICD-10-CM | POA: Insufficient documentation

## 2014-03-08 DIAGNOSIS — R0981 Nasal congestion: Secondary | ICD-10-CM | POA: Diagnosis not present

## 2014-03-08 DIAGNOSIS — R05 Cough: Secondary | ICD-10-CM | POA: Diagnosis not present

## 2014-03-08 DIAGNOSIS — Z7982 Long term (current) use of aspirin: Secondary | ICD-10-CM | POA: Insufficient documentation

## 2014-03-08 DIAGNOSIS — E780 Pure hypercholesterolemia: Secondary | ICD-10-CM | POA: Diagnosis not present

## 2014-03-08 DIAGNOSIS — Z79899 Other long term (current) drug therapy: Secondary | ICD-10-CM | POA: Diagnosis not present

## 2014-03-08 DIAGNOSIS — R319 Hematuria, unspecified: Secondary | ICD-10-CM | POA: Insufficient documentation

## 2014-03-08 DIAGNOSIS — R9431 Abnormal electrocardiogram [ECG] [EKG]: Secondary | ICD-10-CM | POA: Diagnosis not present

## 2014-03-08 DIAGNOSIS — J449 Chronic obstructive pulmonary disease, unspecified: Secondary | ICD-10-CM | POA: Insufficient documentation

## 2014-03-08 DIAGNOSIS — Z87440 Personal history of urinary (tract) infections: Secondary | ICD-10-CM | POA: Diagnosis not present

## 2014-03-08 DIAGNOSIS — Z87891 Personal history of nicotine dependence: Secondary | ICD-10-CM | POA: Insufficient documentation

## 2014-03-08 DIAGNOSIS — J90 Pleural effusion, not elsewhere classified: Secondary | ICD-10-CM | POA: Diagnosis not present

## 2014-03-08 HISTORY — DX: Chronic obstructive pulmonary disease, unspecified: J44.9

## 2014-03-08 LAB — COMPREHENSIVE METABOLIC PANEL
ALBUMIN: 3.5 g/dL (ref 3.5–5.2)
ALK PHOS: 41 U/L (ref 39–117)
ALT: 22 U/L (ref 0–53)
AST: 29 U/L (ref 0–37)
Anion gap: 6 (ref 5–15)
BILIRUBIN TOTAL: 0.6 mg/dL (ref 0.3–1.2)
BUN: 13 mg/dL (ref 6–23)
CHLORIDE: 105 mmol/L (ref 96–112)
CO2: 27 mmol/L (ref 19–32)
Calcium: 8.4 mg/dL (ref 8.4–10.5)
Creatinine, Ser: 1.02 mg/dL (ref 0.50–1.35)
GFR calc Af Amer: 78 mL/min — ABNORMAL LOW (ref >90)
GFR calc non Af Amer: 67 mL/min — ABNORMAL LOW (ref >90)
Glucose, Bld: 132 mg/dL — ABNORMAL HIGH (ref 70–99)
POTASSIUM: 3.7 mmol/L (ref 3.5–5.1)
Sodium: 138 mmol/L (ref 135–145)
Total Protein: 6.5 g/dL (ref 6.0–8.3)

## 2014-03-08 LAB — CBC
HCT: 35.9 % — ABNORMAL LOW (ref 39.0–52.0)
Hemoglobin: 12 g/dL — ABNORMAL LOW (ref 13.0–17.0)
MCH: 31.9 pg (ref 26.0–34.0)
MCHC: 33.4 g/dL (ref 30.0–36.0)
MCV: 95.5 fL (ref 78.0–100.0)
Platelets: 182 10*3/uL (ref 150–400)
RBC: 3.76 MIL/uL — AB (ref 4.22–5.81)
RDW: 13.6 % (ref 11.5–15.5)
WBC: 4.7 10*3/uL (ref 4.0–10.5)

## 2014-03-08 LAB — URINALYSIS, ROUTINE W REFLEX MICROSCOPIC
Bilirubin Urine: NEGATIVE
Glucose, UA: NEGATIVE mg/dL
Ketones, ur: NEGATIVE mg/dL
Leukocytes, UA: NEGATIVE
Nitrite: NEGATIVE
PH: 5.5 (ref 5.0–8.0)
Protein, ur: NEGATIVE mg/dL
SPECIFIC GRAVITY, URINE: 1.008 (ref 1.005–1.030)
Urobilinogen, UA: 0.2 mg/dL (ref 0.0–1.0)

## 2014-03-08 LAB — URINE MICROSCOPIC-ADD ON

## 2014-03-08 LAB — I-STAT TROPONIN, ED: TROPONIN I, POC: 0.01 ng/mL (ref 0.00–0.08)

## 2014-03-08 MED ORDER — ACETAMINOPHEN 500 MG PO TABS
1000.0000 mg | ORAL_TABLET | Freq: Once | ORAL | Status: AC
  Administered 2014-03-08: 1000 mg via ORAL
  Filled 2014-03-08: qty 2

## 2014-03-08 NOTE — ED Notes (Signed)
Pt state that he has had to have foley catheter due to prostate enlargement.  Pt also c/o fever that has been intermittent over the past few days.  Pt also having dry cough and congestion.  Pt states that he isnt happy with all whats going on and doesn't really know what the next step is. Pt states that he has an apt on Friday for some type of scan and wife wants to know is pt can get that done here today.

## 2014-03-08 NOTE — ED Provider Notes (Signed)
CSN: [id number]     Arrival date & time 03/08/14  1055 History   First MD Initiated Contact with Patient 03/08/14 1117     Chief Complaint  Patient presents with  . Fever  . Nasal Congestion     (Consider location/radiation/quality/duration/timing/severity/associated sxs/prior Treatment) HPI Charles Mitchell is a 81 y.o. male with history of BPH, COPD, indwelling Foley catheter comes in for evaluation of fever, cough, congestion. Patient recently returned from [place] where he was seen in the ED twice for intermittent fever, dysuria and lower abdominal discomfort. He was diagnosed with UTI, given Cipro and just completed this antibiotic. He reports having a fever of 101-102 intermittently over the past 3 weeks. He reports having seen his urologist on Friday and had a normal workup. At this time he denies any urinary symptoms, abdominal pain, back pain, chest pain, shortness of breath, numbness or weakness, syncope. He does report associated headache that he attributes to his congestion. PCP is Dr. Haynes Kerns  Recent CT scan done on 02/15/14 was negative for acute intra-abdominal pathology. It does show evidence of a enlarged prostate. Past Medical History  Diagnosis Date  . Enlarged prostate   . COPD (chronic obstructive pulmonary disease)   . High cholesterol   . Hypertension    History reviewed. No pertinent past surgical history. No family history on file. History  Substance Use Topics  . Smoking status: Former Smoker    Types: Cigarettes  . Smokeless tobacco: Not on file  . Alcohol Use: No    Review of Systems A 10 point review of systems was completed and was negative except for pertinent positives and negatives as mentioned in the history of present illness     Allergies  Sulfur  Home Medications   Prior to Admission medications   Medication Sig Start Date End Date Taking? Authorizing Provider  aspirin 325 MG tablet Take 325 mg by mouth daily as needed for fever (fever).    Yes Historical Provider, MD  Budesonide 90 MCG/ACT inhaler Inhale 1 puff into the lungs 2 (two) times daily.   Yes Historical Provider, MD  Budesonide-Formoterol Fumarate (SYMBICORT IN) Inhale 1 puff into the lungs 2 (two) times daily as needed (wheezing and shortness of breath).   Yes Historical Provider, MD  clobetasol cream (TEMOVATE) 2.44 % Apply 1 application topically 2 (two) times daily as needed (eczema).   Yes Historical Provider, MD  pantoprazole (PROTONIX) 40 MG tablet Take 40 mg by mouth daily.   Yes Historical Provider, MD  simvastatin (ZOCOR) 40 MG tablet Take 40 mg by mouth at bedtime.   Yes Historical Provider, MD  vitamin B-12 (CYANOCOBALAMIN) 1000 MCG tablet Take 1,000 mcg by mouth daily.   Yes Historical Provider, MD   BP 109/56 mmHg  Pulse 85  Temp(Src) 98.5 F (36.9 C) (Oral)  Resp 20  SpO2 95% Physical Exam  Constitutional: He is oriented to person, place, and time. He appears well-developed and well-nourished.  HENT:  Head: Normocephalic and atraumatic.  Mouth/Throat: Oropharynx is clear and moist.  Eyes: Conjunctivae are normal. Pupils are equal, round, and reactive to light. Right eye exhibits no discharge. Left eye exhibits no discharge. No scleral icterus.  Neck: Neck supple.  Cardiovascular: Normal rate, regular rhythm and normal heart sounds.   Pulmonary/Chest: Effort normal and breath sounds normal. No respiratory distress. He has no wheezes. He has no rales.  Abdominal: Soft. There is no tenderness.  Genitourinary:  Catheter in place and draining clear to yellow urine.  No frank hematuria. No evidence of irritation or infection at the meatus.  Musculoskeletal: He exhibits no tenderness.  Neurological: He is alert and oriented to person, place, and time.  Cranial Nerves II-XII grossly intact  Skin: Skin is warm and dry. No rash noted.  Psychiatric: He has a normal mood and affect.  Nursing note and vitals reviewed.   ED Course  Procedures (including  critical care time) Labs Review Labs Reviewed  CBC - Abnormal; Notable for the following:    RBC 3.76 (*)    Hemoglobin 12.0 (*)    HCT 35.9 (*)    All other components within normal limits  COMPREHENSIVE METABOLIC PANEL - Abnormal; Notable for the following:    Glucose, Bld 132 (*)    GFR calc non Af Amer 67 (*)    GFR calc Af Amer 78 (*)    All other components within normal limits  URINALYSIS, ROUTINE W REFLEX MICROSCOPIC - Abnormal; Notable for the following:    Hgb urine dipstick MODERATE (*)    All other components within normal limits  URINE MICROSCOPIC-ADD ON  Randolm Idol, ED    Imaging Review Dg Chest Port 1 View  03/08/2014   CLINICAL DATA:  Fever. Cough and congestion over the last couple days.  EXAM: PORTABLE CHEST - 1 VIEW  COMPARISON:  01/05/2004 PET.  FINDINGS: Midline trachea. Normal heart size with a mildly tortuous thoracic aorta. No pleural effusion or pneumothorax. The Chin overlies the left lung apex. Mild left base scarring or atelectasis laterally.  IMPRESSION: No acute cardiopulmonary disease.   Electronically Signed   By: Abigail Miyamoto M.D.   On: 03/08/2014 12:03     EKG Interpretation   Date/Time:  Tuesday March 08 2014 11:57:30 EST Ventricular Rate:  91 PR Interval:  214 QRS Duration: 89 QT Interval:  375 QTC Calculation: 461 R Axis:   81 Text Interpretation:  Sinus rhythm Borderline prolonged PR interval  Borderline right axis deviation Low voltage, precordial leads No old  tracing to compare Confirmed by WARD,  DO, KRISTEN [phone]) on 03/08/2014  12:11:47 PM     Meds given in ED:  Medications  acetaminophen (TYLENOL) tablet 1,000 mg (1,000 mg Oral Given 03/08/14 1205)    Discharge Medication List as of 03/08/2014  2:34 PM     Filed Vitals:   03/08/14 1102 03/08/14 1416  BP: 109/56 109/56  Pulse: 101 85  Temp: 98.8 F (37.1 C) 98.5 F (36.9 C)  TempSrc: Oral Oral  Resp: 16 20  SpO2: 94% 95%    MDM  Vitals stable - WNL  -afebrile Pt resting comfortably in ED. PE--physical exam not concerning for other emergent pathology. Benign abdominal exam Labwork--hematuria on urinalysis. No evidence of UTI Imaging-CT abdomen completed on 2/9 shows no acute intra-abdominal pathologies, no renal or ureteral stones. The prostate is enlarged. Chest x-ray negative. EKG not concerning  Provided a referral to urology. Patient states he will follow up with his PCP, Dr. Haynes Kerns. I discussed all relevant lab findings and imaging results with pt and they verbalized understanding. Discussed f/u with PCP within 48 hrs and return precautions, pt very amenable to plan.  Prior to patient discharge, I discussed and reviewed this case with Dr. Tawnya Crook, who also saw and evaluated the patient.   Final diagnoses:  Hematuria        Verl Dicker, PA-C 03/08/14 1643  Ernestina Patches, MD 03/09/14 [phone]

## 2014-03-08 NOTE — ED Notes (Signed)
Pt discharge by berekly rn. Aware that patient did get discharge papers.

## 2014-03-08 NOTE — Discharge Instructions (Signed)
Hematuria Hematuria is blood in your urine. It can be caused by a bladder infection, kidney infection, prostate infection, kidney stone, or cancer of your urinary tract. Infections can usually be treated with medicine, and a kidney stone usually will pass through your urine. If neither of these is the cause of your hematuria, further workup to find out the reason may be needed. It is very important that you tell your health care provider about any blood you see in your urine, even if the blood stops without treatment or happens without causing pain. Blood in your urine that happens and then stops and then happens again can be a symptom of a very serious condition. Also, pain is not a symptom in the initial stages of many urinary cancers. HOME CARE INSTRUCTIONS   Drink lots of fluid, 3-4 quarts a day. If you have been diagnosed with an infection, cranberry juice is especially recommended, in addition to large amounts of water.  Avoid caffeine, tea, and carbonated beverages because they tend to irritate the bladder.  Avoid alcohol because it may irritate the prostate.  Take all medicines as directed by your health care provider.  If you were prescribed an antibiotic medicine, finish it all even if you start to feel better.  If you have been diagnosed with a kidney stone, follow your health care provider's instructions regarding straining your urine to catch the stone.  Empty your bladder often. Avoid holding urine for long periods of time.  After a bowel movement, women should cleanse front to back. Use each tissue only once.  Empty your bladder before and after sexual intercourse if you are a male. SEEK MEDICAL CARE IF:  You develop back pain.  You have a fever.  You have a feeling of sickness in your stomach (nausea) or vomiting.  Your symptoms are not better in 3 days. Return sooner if you are getting worse. SEEK IMMEDIATE MEDICAL CARE IF:   You develop severe vomiting and are  unable to keep the medicine down.  You develop severe back or abdominal pain despite taking your medicines.  You begin passing a large amount of blood or clots in your urine.  You feel extremely weak or faint, or you pass out. MAKE SURE YOU:   Understand these instructions.  Will watch your condition.  Will get help right away if you are not doing well or get worse. Document Released: 12/24/2004 Document Revised: 05/10/2013 Document Reviewed: 08/24/2012 [facility] Patient Information 2015 [employer], [place]. This information is not intended to replace advice given to you by your health care provider. Make sure you discuss any questions you have with your health care provider.  You were evaluated in the ED today and there does not appear to be an emergent cause for your symptoms. It is important for you to follow-up with urology for definitive care and further evaluation and management of your symptoms. Return to ED for new or worsening symptoms.

## 2014-03-11 DIAGNOSIS — N320 Bladder-neck obstruction: Secondary | ICD-10-CM | POA: Diagnosis not present

## 2014-03-11 DIAGNOSIS — R339 Retention of urine, unspecified: Secondary | ICD-10-CM | POA: Diagnosis not present

## 2014-03-12 ENCOUNTER — Encounter (HOSPITAL_COMMUNITY): Payer: Self-pay | Admitting: Emergency Medicine

## 2014-03-12 ENCOUNTER — Emergency Department (HOSPITAL_COMMUNITY)
Admission: EM | Admit: 2014-03-12 | Discharge: 2014-03-12 | Disposition: A | Discharge status: Home / Self Care | Payer: Medicare Other | Attending: Emergency Medicine | Admitting: Emergency Medicine

## 2014-03-12 DIAGNOSIS — Z87891 Personal history of nicotine dependence: Secondary | ICD-10-CM | POA: Diagnosis not present

## 2014-03-12 DIAGNOSIS — R339 Retention of urine, unspecified: Secondary | ICD-10-CM | POA: Insufficient documentation

## 2014-03-12 DIAGNOSIS — I10 Essential (primary) hypertension: Secondary | ICD-10-CM | POA: Diagnosis not present

## 2014-03-12 DIAGNOSIS — J449 Chronic obstructive pulmonary disease, unspecified: Secondary | ICD-10-CM | POA: Diagnosis not present

## 2014-03-12 LAB — URINALYSIS, ROUTINE W REFLEX MICROSCOPIC
Bilirubin Urine: NEGATIVE
Glucose, UA: NEGATIVE mg/dL
Hgb urine dipstick: NEGATIVE
Ketones, ur: NEGATIVE mg/dL
Leukocytes, UA: NEGATIVE
Nitrite: NEGATIVE
Protein, ur: NEGATIVE mg/dL
Specific Gravity, Urine: 1.01 (ref 1.005–1.030)
Urobilinogen, UA: 0.2 mg/dL (ref 0.0–1.0)
pH: 6.5 (ref 5.0–8.0)

## 2014-03-12 MED ORDER — TAMSULOSIN HCL 0.4 MG PO CAPS: 0.4000 mg | ORAL_CAPSULE | Freq: Every day | ORAL | Status: DC

## 2014-03-12 NOTE — ED Notes (Signed)
Pt states that he has been having urinary problems due to prostate enlargement.  Pt has had catheters and had them removed.  Pt had catheter removed yesterday and having loss of control of urine and "feeling blocking again".  Pt having pain due to the urinary retention.

## 2014-03-12 NOTE — Discharge Instructions (Signed)
Acute Urinary Retention  Acute urinary retention is when you are unable to pee (urinate). Acute urinary retention is common in older men. Prostates can get bigger, which blocks the flow of pee.   HOME CARE  · Drink enough fluids to keep your pee clear or pale yellow.  · If you are sent home with a tube that drains the bladder (catheter), there will be a drainage bag attached to it. There are two types of bags. One is big that you can wear at night without having to empty it. One is smaller and needs to be emptied more often.  ¨ Keep the drainage bag empty.  ¨ Keep the drainage bag lower than your catheter.  · Only take medicine as told by your doctor.  GET HELP IF:  · You have a low-grade fever.  · You have spasms or you are leaking pee when you have spasms.  GET HELP RIGHT AWAY IF:   · You have chills or a fever.  · Your catheter stops draining pee.  · Your catheter falls out.  · You have increased bleeding that does not stop after you have rested and increased the amount of fluids you had been drinking.  MAKE SURE YOU:   · Understand these instructions.  · Will watch your condition.  · Will get help right away if you are not doing well or get worse.  Document Released: 06/12/2007 Document Revised: 10/14/2012 Document Reviewed: 06/04/2012  [employer]® Patient Information ©2015 [employer]. This information is not intended to replace advice given to you by your health care provider. Make sure you discuss any questions you have with your health care provider.

## 2014-03-14 DIAGNOSIS — R338 Other retention of urine: Secondary | ICD-10-CM | POA: Diagnosis not present

## 2014-03-14 DIAGNOSIS — N401 Enlarged prostate with lower urinary tract symptoms: Secondary | ICD-10-CM | POA: Diagnosis not present

## 2014-03-14 DIAGNOSIS — N320 Bladder-neck obstruction: Secondary | ICD-10-CM | POA: Diagnosis not present

## 2014-03-14 DIAGNOSIS — N138 Other obstructive and reflux uropathy: Secondary | ICD-10-CM | POA: Diagnosis not present

## 2014-03-25 DIAGNOSIS — N401 Enlarged prostate with lower urinary tract symptoms: Secondary | ICD-10-CM | POA: Diagnosis not present

## 2014-03-25 DIAGNOSIS — R338 Other retention of urine: Secondary | ICD-10-CM | POA: Diagnosis not present

## 2014-03-25 DIAGNOSIS — N138 Other obstructive and reflux uropathy: Secondary | ICD-10-CM | POA: Diagnosis not present

## 2014-03-28 DIAGNOSIS — N138 Other obstructive and reflux uropathy: Secondary | ICD-10-CM | POA: Diagnosis not present

## 2014-03-28 DIAGNOSIS — N390 Urinary tract infection, site not specified: Secondary | ICD-10-CM | POA: Diagnosis not present

## 2014-03-28 DIAGNOSIS — R338 Other retention of urine: Secondary | ICD-10-CM | POA: Diagnosis not present

## 2014-03-28 DIAGNOSIS — N401 Enlarged prostate with lower urinary tract symptoms: Secondary | ICD-10-CM | POA: Diagnosis not present

## 2014-03-28 NOTE — ED Provider Notes (Signed)
CSN: [id number]     Arrival date & time 03/12/14  1222 History   First MD Initiated Contact with Patient 03/12/14 1325     Chief Complaint  Patient presents with  . Urinary Retention     (Consider location/radiation/quality/duration/timing/severity/associated sxs/prior Treatment) HPI   70yM with urinary retention. Hx of same. Recently had foley catheter which was removed yesterday. Able to void immediately after but then none since. Increasing pain/pressure. No fever or chills. No n/v. No hematuria. Recently completed course of cipro.   Past Medical History  Diagnosis Date  . Enlarged prostate   . COPD (chronic obstructive pulmonary disease)   . High cholesterol   . Hypertension    History reviewed. No pertinent past surgical history. No family history on file. History  Substance Use Topics  . Smoking status: Former Smoker    Types: Cigarettes  . Smokeless tobacco: Not on file  . Alcohol Use: No    Review of Systems  All systems reviewed and negative, other than as noted in HPI.   Allergies  Sulfur  Home Medications   Prior to Admission medications   Medication Sig Start Date End Date Taking? Authorizing Provider  aspirin 325 MG tablet Take 325 mg by mouth daily as needed for fever (fever).   Yes Historical Provider, MD  Budesonide-Formoterol Fumarate (SYMBICORT IN) Inhale 1 puff into the lungs 2 (two) times daily as needed (wheezing and shortness of breath).   Yes Historical Provider, MD  ciprofloxacin (CIPRO) 500 MG tablet Take 500 mg by mouth 2 (two) times daily.  03/09/14  Yes Historical Provider, MD  clobetasol cream (TEMOVATE) 4.09 % Apply 1 application topically 2 (two) times daily as needed (eczema).   Yes Historical Provider, MD  FLOVENT HFA 220 MCG/ACT inhaler Inhale 1 puff into the lungs 2 (two) times daily.  02/23/14  Yes Historical Provider, MD  pantoprazole (PROTONIX) 40 MG tablet Take 40 mg by mouth daily.   Yes Historical Provider, MD  simvastatin (ZOCOR)  40 MG tablet Take 40 mg by mouth at bedtime.   Yes Historical Provider, MD  vitamin B-12 (CYANOCOBALAMIN) 1000 MCG tablet Take 1,000 mcg by mouth daily.   Yes Historical Provider, MD  tamsulosin (FLOMAX) 0.4 MG CAPS capsule Take 0.4 mg by mouth daily after breakfast.  02/26/14   Historical Provider, MD  tamsulosin (FLOMAX) 0.4 MG CAPS capsule Take 1 capsule (0.4 mg total) by mouth daily. 03/12/14   Virgel Manifold, MD   BP 144/65 mmHg  Pulse 75  Temp(Src) 98.1 F (36.7 C) (Oral)  Resp 16  Ht 5\' 8"  (1.727 m)  Wt 135 lb (61.236 kg)  BMI 20.53 kg/m2  SpO2 95% Physical Exam  Constitutional: He appears well-developed and well-nourished. No distress.  HENT:  Head: Normocephalic and atraumatic.  Eyes: Conjunctivae are normal. Right eye exhibits no discharge. Left eye exhibits no discharge.  Neck: Neck supple.  Cardiovascular: Normal rate, regular rhythm and normal heart sounds.  Exam reveals no gallop and no friction rub.   No murmur heard. Pulmonary/Chest: Effort normal and breath sounds normal. No respiratory distress.  Abdominal: Soft. He exhibits no distension. There is tenderness.  Distended tender bladder  Musculoskeletal: He exhibits no edema or tenderness.  Neurological: He is alert.  Skin: Skin is warm and dry.  Psychiatric: He has a normal mood and affect. His behavior is normal. Thought content normal.  Nursing note and vitals reviewed.   ED Course  Procedures (including critical care time) Labs Review Labs Reviewed  URINALYSIS, ROUTINE W REFLEX MICROSCOPIC    Imaging Review No results found.   EKG Interpretation None      MDM   Final diagnoses:  Urinary retention    82yM with urinary retention. Hx of same. Foley placed again. Feels much better. UA unremarkable. Urology FU.     Virgel Manifold, MD 03/28/14 1520

## 2014-04-04 ENCOUNTER — Other Ambulatory Visit: Payer: Self-pay | Admitting: Urology

## 2014-04-13 ENCOUNTER — Encounter (HOSPITAL_BASED_OUTPATIENT_CLINIC_OR_DEPARTMENT_OTHER): Payer: Self-pay | Admitting: *Deleted

## 2014-04-14 ENCOUNTER — Encounter (HOSPITAL_BASED_OUTPATIENT_CLINIC_OR_DEPARTMENT_OTHER): Payer: Self-pay | Admitting: *Deleted

## 2014-04-14 NOTE — Progress Notes (Addendum)
NPO AFTER MN. ARRIVE AT 9191. NEEDS HG.  WILL DO FLOVENT INHALER AM DOS W/ SIPS OF WATER.

## 2014-04-18 NOTE — H&P (Signed)
History of Present Illness F/u - PCP Dr. Joylene Draft.     1-urinary retention-March 2016-patient developed urinary retention last month. He felt a voiding trial. He underwent urodynamics which revealed a bladder capacity of 212 mL, unstable bladder. Detrusor contractions were 37-40 cm of water with a max flow of 5 cc. I reviewed the images and then tracing. He only left a postvoid of 80 cc therefore the catheter was left out. He went into retention again had about 600 cc in his bladder. Foley was replaced. He also underwent CT scan February 2016 which showed a normal urinary tract and about a 90 g prostate.      2- BPH-March 2016-patient has a long history of BPH with elevated PSA. Patient with an 85-90 g prostate on ultrasound and CT scan February 2016. He was on tamsulosin but he stopped after the second failed voiding trial. Prior to his retention episode he reports frequency urgency and weak stream.     3- prostate cancer screening-He was seen back in the late 90s and then again 2006-2009 with a PSA around 5-6. He had a prostate biopsy which was reported as benign. Follow-up PSA testing February 2008 revealed a PSA of 1.77.   -February 2016 he had a normal DRE.     4-erectile dysfunction-patient's wife died 3 years ago and has been sexually active with a new partner for the past 2 years. He required use of PDE 5 inhibitors for dissection.      March 2016 interval history  patient returns and continued management of BPH, elevated PSA and urinary retention. His Foley catheter stopped draining and he is leaking around it. He needs a catheter change.     His urine culture grew staph. We'll start him on nitrofurantoin a few days before greenlight photo vaporization.    His PSA was 4.95. We discussed this leads to a normal PSA density in the evening if he were to have prostate cancer this would be considered low risk. Overall I think his PSA is stable considering it was around 5 or 6 in  the past.     Past Medical History Problems  1. History of BPH (benign prostatic hyperplasia) (N40.0) 2. History of Decreased hearing (H91.90) 3. History of Diverticulosis (K57.90) 4. History of asthma (Z87.09) 5. History of chicken pox (Z86.19) 6. History of eczema (Z87.2) 7. History of esophageal reflux (Z87.19) 8. History of hyperlipidemia (Z86.39) 9. History of malignant neoplasm of colon (Z85.038) 10. History of pertussis (Z86.19) 11. History of renal calculi (Z87.442) 12. History of Rectal adenoma (D12.8) 13. History of Vitamin B 12 deficiency (E53.8)  Surgical History Problems  1. History of Colon Surgery 2. History of Complete Colonoscopy  Current Meds 1. Anucort-HC 25 MG Rectal Suppository;  Therapy: (Recorded:26Feb2016) to Recorded 2. Ciprofloxacin HCl - 250 MG Oral Tablet;  Therapy: (Recorded:26Feb2016) to Recorded 3. Ciprofloxacin HCl - 500 MG Oral Tablet; TAKE 1 TABLET BID;  Therapy: [id number] to (Last Rx:02Mar2016)  Requested for: [id number] Ordered 4. Cyanocobalamin 1000 MCG/ML Injection Solution;  Therapy: (Recorded:26Feb2016) to Recorded 5. Flovent 220 MCG/ACT AERO;  Therapy: (Recorded:26Feb2016) to Recorded 6. Levitra 20 MG Oral Tablet;  Therapy: (Recorded:26Feb2016) to Recorded 7. Phenazopyridine HCl TABS;  Therapy: (Recorded:26Feb2016) to Recorded 8. ProAir HFA 108 (90 Base) MCG/ACT Inhalation Aerosol Solution;  Therapy: (Recorded:26Feb2016) to Recorded 9. Prolia 60 MG/ML Subcutaneous Solution;  Therapy: (Recorded:26Feb2016) to Recorded 10. Protonix 40 MG Oral Tablet Delayed Release;   Therapy: (Recorded:26Feb2016) to Recorded 11. Tamsulosin HCl - 0.4 MG Oral  Capsule;   Therapy: (Recorded:26Feb2016) to Recorded 12. Vitamin B-12 TABS;   Therapy: (Recorded:26Feb2016) to Recorded 13. Xylocaine Viscous 2 % SOLN;   Therapy: (Recorded:26Feb2016) to Recorded  Allergies Medication  1. Sulfa Drugs  Social History Problems  1. Alcohol use  (Z78.9) 2. Caffeine use (F15.90)   1 a day 3. Former smoker [phone] 4. History of Former smoker [phone]) 5. Number of children   1 son    1 daughter 89. Retired 78. Widowed  Physical Exam Abdomen: The abdomen is soft and nontender. No masses are palpated. No CVA tenderness. No hernias are palpable. No hepatosplenomegaly noted.    Procedure Foley catheter was irrigated. irrigates normally. Balloon was seated at the bladder neck. It appears to be in normal position, catheter is mobile.   Assessment Assessed  1. Acute urinary retention (R33.8) 2. Benign prostatic hyperplasia with urinary obstruction (N40.1,N13.8) 3. Bacteriuria, asymptomatic (N39.0)  Plan Bacteriuria, asymptomatic  1. Start: Nitrofurantoin Macrocrystal 100 MG Oral Capsule; Take 1 capsule twice daily 2. Follow-up Office  Follow-up  Status: Hold For - Appointment,Date of Service  Requested  for: 21Mar2016  Discussion/Summary elevated PSA - We discussed this leads to a normal PSA density in the evening if he were to have prostate cancer this would be considered low risk. Overall I think his PSA is stable considering it was around 5 or 6 in the past. He agrees with continued surveillance.    BPH with urinary retention-Foley irrigates normally. He may have had a bladder spasm or small obstruction.    Asymptomatic bacteriuria-nitrofurantoin sent to start 3 days before greenlight.      Signatures Electronically signed by : Festus Aloe, M.D.; Mar 28 2014  4:27PM EST   Add: Urine culture grew staph and I sent nitrofurantoin for patient to start prior to procedure.

## 2014-04-19 ENCOUNTER — Encounter (HOSPITAL_BASED_OUTPATIENT_CLINIC_OR_DEPARTMENT_OTHER): Payer: Self-pay | Admitting: *Deleted

## 2014-04-19 ENCOUNTER — Ambulatory Visit (HOSPITAL_BASED_OUTPATIENT_CLINIC_OR_DEPARTMENT_OTHER): Payer: Medicare Other | Admitting: Anesthesiology

## 2014-04-19 ENCOUNTER — Encounter (HOSPITAL_BASED_OUTPATIENT_CLINIC_OR_DEPARTMENT_OTHER)
Admission: RE | Disposition: A | Discharge status: Home / Self Care | Payer: Self-pay | Source: Ambulatory Visit | Attending: Urology

## 2014-04-19 ENCOUNTER — Ambulatory Visit (HOSPITAL_BASED_OUTPATIENT_CLINIC_OR_DEPARTMENT_OTHER)
Admission: RE | Admit: 2014-04-19 | Discharge: 2014-04-19 | Disposition: A | Discharge status: Home / Self Care | Payer: Medicare Other | Source: Ambulatory Visit | Attending: Urology | Admitting: Urology

## 2014-04-19 DIAGNOSIS — R338 Other retention of urine: Secondary | ICD-10-CM | POA: Insufficient documentation

## 2014-04-19 DIAGNOSIS — Z79899 Other long term (current) drug therapy: Secondary | ICD-10-CM | POA: Insufficient documentation

## 2014-04-19 DIAGNOSIS — K219 Gastro-esophageal reflux disease without esophagitis: Secondary | ICD-10-CM | POA: Insufficient documentation

## 2014-04-19 DIAGNOSIS — J449 Chronic obstructive pulmonary disease, unspecified: Secondary | ICD-10-CM | POA: Diagnosis not present

## 2014-04-19 DIAGNOSIS — Z87442 Personal history of urinary calculi: Secondary | ICD-10-CM | POA: Insufficient documentation

## 2014-04-19 DIAGNOSIS — Z85038 Personal history of other malignant neoplasm of large intestine: Secondary | ICD-10-CM | POA: Diagnosis not present

## 2014-04-19 DIAGNOSIS — R339 Retention of urine, unspecified: Secondary | ICD-10-CM | POA: Diagnosis not present

## 2014-04-19 DIAGNOSIS — Z87891 Personal history of nicotine dependence: Secondary | ICD-10-CM | POA: Diagnosis not present

## 2014-04-19 DIAGNOSIS — Z7982 Long term (current) use of aspirin: Secondary | ICD-10-CM | POA: Diagnosis not present

## 2014-04-19 DIAGNOSIS — N401 Enlarged prostate with lower urinary tract symptoms: Secondary | ICD-10-CM | POA: Diagnosis not present

## 2014-04-19 DIAGNOSIS — E785 Hyperlipidemia, unspecified: Secondary | ICD-10-CM | POA: Insufficient documentation

## 2014-04-19 DIAGNOSIS — Z882 Allergy status to sulfonamides status: Secondary | ICD-10-CM | POA: Diagnosis not present

## 2014-04-19 DIAGNOSIS — N400 Enlarged prostate without lower urinary tract symptoms: Secondary | ICD-10-CM | POA: Diagnosis not present

## 2014-04-19 HISTORY — DX: Personal history of malignant melanoma of skin: Z85.820

## 2014-04-19 HISTORY — DX: Personal history of adenomatous and serrated colon polyps: Z86.0101

## 2014-04-19 HISTORY — DX: Diverticulosis of large intestine without perforation or abscess without bleeding: K57.30

## 2014-04-19 HISTORY — PX: GREEN LIGHT LASER TURP (TRANSURETHRAL RESECTION OF PROSTATE: SHX6260

## 2014-04-19 HISTORY — DX: Presence of urogenital implants: Z96.0

## 2014-04-19 HISTORY — DX: Presence of other specified devices: Z97.8

## 2014-04-19 HISTORY — DX: Gastro-esophageal reflux disease without esophagitis: K21.9

## 2014-04-19 HISTORY — DX: Other specified postprocedural states: Z98.890

## 2014-04-19 HISTORY — DX: Other retention of urine: R33.8

## 2014-04-19 HISTORY — DX: Personal history of other malignant neoplasm of rectum, rectosigmoid junction, and anus: Z85.048

## 2014-04-19 HISTORY — DX: Personal history of colonic polyps: Z86.010

## 2014-04-19 HISTORY — DX: Dermatitis, unspecified: L30.9

## 2014-04-19 HISTORY — DX: Benign prostatic hyperplasia with lower urinary tract symptoms: N40.1

## 2014-04-19 HISTORY — DX: Hyperlipidemia, unspecified: E78.5

## 2014-04-19 LAB — POCT HEMOGLOBIN-HEMACUE: Hemoglobin: 10.2 g/dL — ABNORMAL LOW (ref 13.0–17.0)

## 2014-04-19 SURGERY — GREEN LIGHT LASER TURP (TRANSURETHRAL RESECTION OF PROSTATE
Anesthesia: General | Site: Prostate

## 2014-04-19 MED ORDER — BELLADONNA ALKALOIDS-OPIUM 16.2-60 MG RE SUPP
RECTAL | Status: AC
  Filled 2014-04-19: qty 1

## 2014-04-19 MED ORDER — EPHEDRINE SULFATE 50 MG/ML IJ SOLN
INTRAMUSCULAR | Status: DC | PRN
  Administered 2014-04-19: 10 mg via INTRAVENOUS

## 2014-04-19 MED ORDER — BELLADONNA ALKALOIDS-OPIUM 16.2-60 MG RE SUPP
RECTAL | Status: DC | PRN
  Administered 2014-04-19: 1 via RECTAL

## 2014-04-19 MED ORDER — CEFAZOLIN SODIUM-DEXTROSE 2-3 GM-% IV SOLR
2.0000 g | INTRAVENOUS | Status: AC
  Administered 2014-04-19: 2 g via INTRAVENOUS
  Filled 2014-04-19: qty 50

## 2014-04-19 MED ORDER — CEFAZOLIN SODIUM-DEXTROSE 2-3 GM-% IV SOLR
INTRAVENOUS | Status: AC
  Filled 2014-04-19: qty 50

## 2014-04-19 MED ORDER — DEXAMETHASONE SODIUM PHOSPHATE 10 MG/ML IJ SOLN
INTRAMUSCULAR | Status: DC | PRN
  Administered 2014-04-19: 10 mg via INTRAVENOUS

## 2014-04-19 MED ORDER — NITROFURANTOIN MONOHYD MACRO 100 MG PO CAPS: 100.0000 mg | ORAL_CAPSULE | Freq: Two times a day (BID) | ORAL | Status: DC

## 2014-04-19 MED ORDER — PROPOFOL 10 MG/ML IV BOLUS
INTRAVENOUS | Status: DC | PRN
  Administered 2014-04-19: 150 mg via INTRAVENOUS

## 2014-04-19 MED ORDER — FENTANYL CITRATE 0.05 MG/ML IJ SOLN
INTRAMUSCULAR | Status: DC | PRN
  Administered 2014-04-19: 12.5 ug via INTRAVENOUS
  Administered 2014-04-19: 25 ug via INTRAVENOUS
  Administered 2014-04-19 (×9): 12.5 ug via INTRAVENOUS

## 2014-04-19 MED ORDER — ALFUZOSIN HCL ER 10 MG PO TB24: 10.0000 mg | ORAL_TABLET | Freq: Every day | ORAL | Status: DC

## 2014-04-19 MED ORDER — ACETAMINOPHEN 10 MG/ML IV SOLN
INTRAVENOUS | Status: DC | PRN
  Administered 2014-04-19: 1000 mg via INTRAVENOUS

## 2014-04-19 MED ORDER — LIDOCAINE HCL (CARDIAC) 20 MG/ML IV SOLN
INTRAVENOUS | Status: DC | PRN
  Administered 2014-04-19: 60 mg via INTRAVENOUS

## 2014-04-19 MED ORDER — FENTANYL CITRATE 0.05 MG/ML IJ SOLN
INTRAMUSCULAR | Status: AC
  Filled 2014-04-19: qty 6

## 2014-04-19 MED ORDER — FENTANYL CITRATE 0.05 MG/ML IJ SOLN
25.0000 ug | INTRAMUSCULAR | Status: DC | PRN
  Filled 2014-04-19: qty 1

## 2014-04-19 MED ORDER — LACTATED RINGERS IV SOLN
INTRAVENOUS | Status: DC
  Administered 2014-04-19: 09:00:00 via INTRAVENOUS
  Filled 2014-04-19: qty 1000

## 2014-04-19 MED ORDER — ASPIRIN 325 MG PO TABS
325.0000 mg | ORAL_TABLET | Freq: Every day | ORAL | Status: DC | PRN
Start: 2014-04-21 — End: 2015-05-08

## 2014-04-19 MED ORDER — PROMETHAZINE HCL 25 MG/ML IJ SOLN
6.2500 mg | INTRAMUSCULAR | Status: DC | PRN
  Filled 2014-04-19: qty 1

## 2014-04-19 MED ORDER — CEFAZOLIN SODIUM 1-5 GM-% IV SOLN
1.0000 g | INTRAVENOUS | Status: DC
  Filled 2014-04-19: qty 50

## 2014-04-19 MED ORDER — ONDANSETRON HCL 4 MG/2ML IJ SOLN
INTRAMUSCULAR | Status: DC | PRN
  Administered 2014-04-19: 4 mg via INTRAVENOUS

## 2014-04-19 MED ORDER — SODIUM CHLORIDE 0.9 % IR SOLN
Status: DC | PRN
  Administered 2014-04-19: 6000 mL

## 2014-04-19 MED ORDER — LACTATED RINGERS IV SOLN
INTRAVENOUS | Status: DC | PRN
  Administered 2014-04-19: 09:00:00 via INTRAVENOUS

## 2014-04-19 SURGICAL SUPPLY — 31 items
BAG URINE DRAINAGE (UROLOGICAL SUPPLIES) ×3 IMPLANT
BAG URO CATCHER STRL LF (DRAPE) ×3 IMPLANT
CANISTER SUCT LVC 12 LTR MEDI- (MISCELLANEOUS) ×4 IMPLANT
CATH COUDE FOLEY 2W 5CC 18FR (CATHETERS) ×2 IMPLANT
CATH FOLEY 2WAY SLVR  5CC 18FR (CATHETERS)
CATH FOLEY 2WAY SLVR 5CC 18FR (CATHETERS) IMPLANT
CLOTH BEACON ORANGE TIMEOUT ST (SAFETY) ×3 IMPLANT
ELECT BIVAP BIPO 22/24 DONUT (ELECTROSURGICAL)
ELECT BUTTON HF 24-28F 2 30DE (ELECTRODE) IMPLANT
ELECT LOOP MED HF 24F 12D (CUTTING LOOP) IMPLANT
ELECTRD BIVAP BIPO 22/24 DONUT (ELECTROSURGICAL) IMPLANT
FEE RENTAL LASER GREENLIGHT (Laser) ×1 IMPLANT
GLOVE BIO SURGEON STRL SZ 6.5 (GLOVE) ×1 IMPLANT
GLOVE BIO SURGEON STRL SZ7.5 (GLOVE) ×3 IMPLANT
GLOVE BIO SURGEONS STRL SZ 6.5 (GLOVE) ×1
GLOVE INDICATOR 6.5 STRL GRN (GLOVE) ×2 IMPLANT
GLOVE INDICATOR 7.5 STRL GRN (GLOVE) ×4 IMPLANT
GOWN STRL REUS W/ TWL LRG LVL3 (GOWN DISPOSABLE) IMPLANT
GOWN STRL REUS W/ TWL XL LVL3 (GOWN DISPOSABLE) ×1 IMPLANT
GOWN STRL REUS W/TWL LRG LVL3 (GOWN DISPOSABLE) ×3
GOWN STRL REUS W/TWL XL LVL3 (GOWN DISPOSABLE) ×3
HOLDER FOLEY CATH W/STRAP (MISCELLANEOUS) ×2 IMPLANT
IV NS 1000ML (IV SOLUTION) ×3
IV NS 1000ML BAXH (IV SOLUTION) ×1 IMPLANT
IV NS IRRIG 3000ML ARTHROMATIC (IV SOLUTION) ×13 IMPLANT
LASER FIBER /GREENLIGHT LASER (Laser) ×3 IMPLANT
LASER GREENLIGHT RENTAL P/PROC (Laser) ×3 IMPLANT
LOOP CUT BIPOLAR 24F LRG (ELECTROSURGICAL) IMPLANT
PACK CYSTO (CUSTOM PROCEDURE TRAY) ×3 IMPLANT
SYR 30ML LL (SYRINGE) ×2 IMPLANT
SYRINGE IRR TOOMEY STRL 70CC (SYRINGE) IMPLANT

## 2014-04-19 NOTE — Anesthesia Postprocedure Evaluation (Signed)
  Anesthesia Post-op Note  Patient: Charles Mitchell  Procedure(s) Performed: Procedure(s) (LRB): GREEN LIGHT LASER TURP (TRANSURETHRAL RESECTION OF PROSTATE (N/A)  Patient Location: PACU  Anesthesia Type: General  Level of Consciousness: awake and alert   Airway and Oxygen Therapy: Patient Spontanous Breathing  Post-op Pain: mild  Post-op Assessment: Post-op Vital signs reviewed, Patient's Cardiovascular Status Stable, Respiratory Function Stable, Patent Airway and No signs of Nausea or vomiting  Last Vitals:  Filed Vitals:   04/19/14 1300  BP: 127/58  Pulse: 72  Temp: 36.4 C  Resp: 18    Post-op Vital Signs: stable   Complications: No apparent anesthesia complications

## 2014-04-19 NOTE — Op Note (Addendum)
Preoperative diagnosis: BPH, urinary retention Postoperative diagnosis: Same  Procedure: Exam under anesthesia, Greenlight photo vaporization of the prostate  Surgeon: Junious Silk  Anesthesia: Denneny  Type of anesthesia: Gen.  Indication for procedure: Patient is an 81 year old with a history of BPH and urinary retention who had failed several voiding trials. It also been on tamsulosin. He presented for greenlight photo vaporization of the prostate to relieve obstruction and hopefully begin voiding again on his own.  Findings: On exam under anesthesia the penis was normal without mass or lesion. The testicles were descended bilaterally and palpably normal. On digital rectal exam the prostate broad, smooth with all landmarks preserved. There were no hard areas or nodules.  On cystoscopy the urethra was normal. The prostatic urethra was elongated and obstructed by lateral lobes. The bladder appeared normal. The trigone and ureteral orifices were in their normal orthotopic position with clear efflux.  Description of procedure: After consent was obtained the patient was brought to the operating room. After adequate anesthesia he is placed in lithotomy position and prepped and draped in the usual sterile fashion. A timeout was performed to confirm the patient and procedure. An exam under anesthesia was performed. The cystoscope was passed per urethra and the bladder inspected. There also no foreign bodies or stones in the bladder. The laser was deployed and at 62 W incisions were made at 5 and 7:00 down to the bladder neck. These incisions were brought down into the usual hockey-stick incisions at the apex bilaterally. Then working from anterior to posterior bladder neck down the very inferior backed bladder neck the left lateral lobe was vaporized and then the right lateral lobe was vaporized. The bladder was drained and some residual tissue was vaporized bilaterally. This created an excellent channel.  Hemostasis was excellent at low-pressure. The ureteral orifices were examined and noted to be normal without injury.  The bladder was filled and the scope removed. An 89 French coud catheter was passed without difficulty. The balloon was inflated and seated at the bladder neck. The urine was clear. The patient was awakened taken to recovery room in stable condition.  Complications: None Blood loss: Minimal Specimens: None  Drains: 18 French coud foley catheter  Laser stats: 80-120 watts 168,140 joules 38:40 min

## 2014-04-19 NOTE — Transfer of Care (Signed)
Immediate Anesthesia Transfer of Care Note  Patient: Charles Mitchell  Procedure(s) Performed: Procedure(s) (LRB): GREEN LIGHT LASER TURP (TRANSURETHRAL RESECTION OF PROSTATE (N/A)  Patient Location: PACU  Anesthesia Type: General  Level of Consciousness: awake, sedated, patient cooperative and responds to stimulation  Airway & Oxygen Therapy: Patient Spontanous Breathing and Patient connected to face mask oxygen  Post-op Assessment: Report given to PACU RN, Post -op Vital signs reviewed and stable and Patient moving all extremities  Post vital signs: Reviewed and stable  Complications: No apparent anesthesia complications

## 2014-04-19 NOTE — Interval H&P Note (Signed)
History and Physical Interval Note:  04/19/2014 9:54 AM  Charles Mitchell  has presented today for surgery, with the diagnosis of BENIGN PROSTATIC HYPERPLASIA, URINARY RETENTION  The various methods of treatment have been discussed with the patient and family. After consideration of risks, benefits and other options for treatment, the patient has consented to  Procedure(s): GREEN LIGHT LASER TURP (TRANSURETHRAL RESECTION OF PROSTATE (N/A) as a surgical intervention .  The patient's history has been reviewed, patient examined, no change in status, stable for surgery.  I have reviewed the patient's chart and labs.  Questions were answered to the patient's satisfaction.  Discussed possible conversion to TURP and staged procedure.    Durenda Pechacek

## 2014-04-19 NOTE — Anesthesia Procedure Notes (Signed)
Procedure Name: LMA Insertion Date/Time: 04/19/2014 10:05 AM Performed by: Justice Rocher Pre-anesthesia Checklist: Patient identified, Emergency Drugs available, Suction available and Patient being monitored Patient Re-evaluated:Patient Re-evaluated prior to inductionOxygen Delivery Method: Circle System Utilized Preoxygenation: Pre-oxygenation with 100% oxygen Intubation Type: IV induction Ventilation: Mask ventilation without difficulty LMA: LMA inserted LMA Size: 4.0 Number of attempts: 1 Airway Equipment and Method: Bite block Placement Confirmation: positive ETCO2 Tube secured with: Tape Dental Injury: Teeth and Oropharynx as per pre-operative assessment

## 2014-04-19 NOTE — Discharge Instructions (Signed)
Foley Catheter Care °A Foley catheter is a soft, flexible tube that is placed into the bladder to drain urine. A Foley catheter may be inserted if: °· You leak urine or are not able to control when you urinate (urinary incontinence). °· You are not able to urinate when you need to (urinary retention). °· You had prostate surgery or surgery on the genitals. °· You have certain medical conditions, such as multiple sclerosis, dementia, or a spinal cord injury. °If you are going home with a Foley catheter in place, follow the instructions below. °TAKING CARE OF THE CATHETER °1. Wash your hands with soap and water. °2. Using mild soap and warm water on a clean washcloth: °· Clean the area on your body closest to the catheter insertion site using a circular motion, moving away from the catheter. Never wipe toward the catheter because this could sweep bacteria up into the urethra and cause infection. °· Remove all traces of soap. Pat the area dry with a clean towel. For males, reposition the foreskin. °3. Attach the catheter to your leg so there is no tension on the catheter. Use adhesive tape or a leg strap. If you are using adhesive tape, remove any sticky residue left behind by the previous tape you used. °4. Keep the drainage bag below the level of the bladder, but keep it off the floor. °5. Check throughout the day to be sure the catheter is working and urine is draining freely. Make sure the tubing does not become kinked. °6. Do not pull on the catheter or try to remove it. Pulling could damage internal tissues. °TAKING CARE OF THE DRAINAGE BAGS °You will be given two drainage bags to take home. One is a large overnight drainage bag, and the other is a smaller leg bag that fits underneath clothing. You may wear the overnight bag at any time, but you should never wear the smaller leg bag at night. Follow the instructions below for how to empty, change, and clean your drainage bags. °Emptying the Drainage Bag °You must  empty your drainage bag when it is  -½ full or at least 2-3 times a day. °1. Wash your hands with soap and water. °2. Keep the drainage bag below your hips, below the level of your bladder. This stops urine from going back into the tubing and into your bladder. °3. Hold the dirty bag over the toilet or a clean container. °4. Open the pour spout at the bottom of the bag and empty the urine into the toilet or container. Do not let the pour spout touch the toilet, container, or any other surface. Doing so can place bacteria on the bag, which can cause an infection. °5. Clean the pour spout with a gauze pad or cotton ball that has rubbing alcohol on it. °6. Close the pour spout. °7. Attach the bag to your leg with adhesive tape or a leg strap. °8. Wash your hands well. °Changing the Drainage Bag °Change your drainage bag once a month or sooner if it starts to smell bad or look dirty. Below are steps to follow when changing the drainage bag. °1. Wash your hands with soap and water. °2. Pinch off the rubber catheter so that urine does not spill out. °3. Disconnect the catheter tube from the drainage tube at the connection valve. Do not let the tubes touch any surface. °4. Clean the end of the catheter tube with an alcohol wipe. Use a different alcohol wipe to clean the   end of the drainage tube. 5. Connect the catheter tube to the drainage tube of the clean drainage bag. 6. Attach the new bag to the leg with adhesive tape or a leg strap. Avoid attaching the new bag too tightly. 7. Wash your hands well. Cleaning the Drainage Bag 1. Wash your hands with soap and water. 2. Wash the bag in warm, soapy water. 3. Rinse the bag thoroughly with warm water. 4. Fill the bag with a solution of white vinegar and water (1 cup vinegar to 1 qt warm water [.2 L vinegar to 1 L warm water]). Close the bag and soak it for 30 minutes in the solution. 5. Rinse the bag with warm water. 6. Hang the bag to dry with the pour spout open  and hanging downward. 7. Store the clean bag (once it is dry) in a clean plastic bag. 8. Wash your hands well. PREVENTING INFECTION  Wash your hands before and after handling your catheter.  Take showers daily and wash the area where the catheter enters your body. Do not take baths. Replace wet leg straps with dry ones, if this applies.  Do not use powders, sprays, or lotions on the genital area. Only use creams, lotions, or ointments as directed by your caregiver.  For females, wipe from front to back after each bowel movement.  Drink enough fluids to keep your urine clear or pale yellow unless you have a fluid restriction.  Do not let the drainage bag or tubing touch or lie on the floor.  Wear cotton underwear to absorb moisture and to keep your skin drier. SEEK MEDICAL CARE IF:   Your urine is cloudy or smells unusually bad.  Your catheter becomes clogged.  You are not draining urine into the bag or your bladder feels full.  Your catheter starts to leak. SEEK IMMEDIATE MEDICAL CARE IF:   You have pain, swelling, redness, or pus where the catheter enters the body.  You have pain in the abdomen, legs, lower back, or bladder.  You have a fever.  You see blood fill the catheter, or your urine is pink or red.  You have nausea, vomiting, or chills.  Your catheter gets pulled out. MAKE SURE YOU:   Understand these instructions.  Will watch your condition.  Will get help right away if you are not doing well or get worse. Document Released: 12/24/2004 Document Revised: 05/10/2013 Document Reviewed: 12/16/2011 [facility] Patient Information 2015 [place], [place]. This information is not intended to replace advice given to you by your health care provider. Make sure you discuss any questions you have with your health care provider. Prostate Laser Surgery, Care After Refer to this sheet in the next few weeks. These instructions provide you with information on caring for yourself  after your procedure. Your health care provider may also give you more specific instructions. Your treatment has been planned according to current medical practices, but problems sometimes occur. Call your health care provider if you have any problems or questions after your procedure. WHAT TO EXPECT AFTER THE PROCEDURE  You may notice blood in your urine that could last up to 3 weeks.  After your catheter is removed, you will have burning (especially at the tip of your penis) when you urinate, especially at the end of urination. For the first few weeks after your procedure, you will feel the need to urinate often. HOME CARE INSTRUCTIONS  7. Do not perform vigorous exercise, especially heavy lifting, for 1 week or as  directed by your health care provider. °8. Avoid sexual activity for 4-6 weeks or as directed by your health care provider. °9. Do not ride in a car for extended periods for 1 month or as directed by your health care provider. °10. Do not strain to have a bowel movement. Drink a lot of fluids and and make sure you get enough fiber in your diet. °11. Drink enough fluids to keep your urine clear or pale yellow. °SEEK IMMEDIATE MEDICAL CARE IF:  °9. Your catheter has been removed and you are suddenly unable to urinate. °10. Your catheter has not been removed, and it develops a blockage. °11. You start to have blood clots in your urine. °12. The blood in your urine becomes persistent or gets thick. °13. Your temperature is greater than 100.5°F (38.1°C). °14. You develop chest pains. °15. You develop shortness of breath. °16. You develop leg swelling or pain. °MAKE SURE YOU: °8. Understand these instructions. °9. Will watch your condition. °10. Will get help right away if you are not doing well or get worse. °Document Released: 12/24/2004 Document Revised: 12/29/2012 Document Reviewed: 06/15/2012 °[employer]® Patient Information ©2015 [employer]. This information is not intended to replace advice  given to you by your health care provider. Make sure you discuss any questions you have with your health care provider. ° °Post Anesthesia Home Care Instructions ° °Activity: °Get plenty of rest for the remainder of the day. A responsible adult should stay with you for 24 hours following the procedure.  °For the next 24 hours, DO NOT: °-Drive a car °-Operate machinery °-Drink alcoholic beverages °-Take any medication unless instructed by your physician °-Make any legal decisions or sign important papers. ° °Meals: °Start with liquid foods such as gelatin or soup. Progress to regular foods as tolerated. Avoid greasy, spicy, heavy foods. If nausea and/or vomiting occur, drink only clear liquids until the nausea and/or vomiting subsides. Call your physician if vomiting continues. ° °Special Instructions/Symptoms: °Your throat may feel dry or sore from the anesthesia or the breathing tube placed in your throat during surgery. If this causes discomfort, gargle with warm salt water. The discomfort should disappear within 24 hours. ° °

## 2014-04-19 NOTE — Anesthesia Preprocedure Evaluation (Signed)
Anesthesia Evaluation  Patient identified by MRN, date of birth, ID band Patient awake    Reviewed: Allergy & Precautions, NPO status , Patient's Chart, lab work & pertinent test results  Airway Mallampati: II  TM Distance: >3 FB Neck ROM: Full    Dental no notable dental hx.    Pulmonary COPD COPD inhaler, former smoker,  breath sounds clear to auscultation  Pulmonary exam normal       Cardiovascular negative cardio ROS  Rhythm:Regular Rate:Normal     Neuro/Psych negative neurological ROS  negative psych ROS   GI/Hepatic Neg liver ROS, GERD-  Medicated,  Endo/Other  negative endocrine ROS  Renal/GU negative Renal ROS  negative genitourinary   Musculoskeletal negative musculoskeletal ROS (+)   Abdominal   Peds negative pediatric ROS (+)  Hematology negative hematology ROS (+)   Anesthesia Other Findings   Reproductive/Obstetrics negative OB ROS                             Anesthesia Physical Anesthesia Plan  ASA: II  Anesthesia Plan: General   Post-op Pain Management:    Induction: Intravenous  Airway Management Planned: LMA  Additional Equipment:   Intra-op Plan:   Post-operative Plan: Extubation in OR  Informed Consent: I have reviewed the patients History and Physical, chart, labs and discussed the procedure including the risks, benefits and alternatives for the proposed anesthesia with the patient or authorized representative who has indicated his/her understanding and acceptance.   Dental advisory given  Plan Discussed with: CRNA  Anesthesia Plan Comments:         Anesthesia Quick Evaluation

## 2014-04-20 ENCOUNTER — Encounter (HOSPITAL_BASED_OUTPATIENT_CLINIC_OR_DEPARTMENT_OTHER): Payer: Self-pay | Admitting: Urology

## 2014-06-09 ENCOUNTER — Encounter: Payer: Self-pay | Admitting: Internal Medicine

## 2014-07-20 DIAGNOSIS — N401 Enlarged prostate with lower urinary tract symptoms: Secondary | ICD-10-CM | POA: Diagnosis not present

## 2014-07-20 DIAGNOSIS — R3915 Urgency of urination: Secondary | ICD-10-CM | POA: Diagnosis not present

## 2014-07-20 DIAGNOSIS — N138 Other obstructive and reflux uropathy: Secondary | ICD-10-CM | POA: Diagnosis not present

## 2014-07-20 DIAGNOSIS — R972 Elevated prostate specific antigen [PSA]: Secondary | ICD-10-CM | POA: Diagnosis not present

## 2014-07-22 DIAGNOSIS — D1801 Hemangioma of skin and subcutaneous tissue: Secondary | ICD-10-CM | POA: Diagnosis not present

## 2014-07-22 DIAGNOSIS — Z85828 Personal history of other malignant neoplasm of skin: Secondary | ICD-10-CM | POA: Diagnosis not present

## 2014-07-22 DIAGNOSIS — L812 Freckles: Secondary | ICD-10-CM | POA: Diagnosis not present

## 2014-07-22 DIAGNOSIS — L821 Other seborrheic keratosis: Secondary | ICD-10-CM | POA: Diagnosis not present

## 2014-07-22 DIAGNOSIS — B070 Plantar wart: Secondary | ICD-10-CM | POA: Diagnosis not present

## 2014-07-22 DIAGNOSIS — Z85820 Personal history of malignant melanoma of skin: Secondary | ICD-10-CM | POA: Diagnosis not present

## 2014-07-22 DIAGNOSIS — D485 Neoplasm of uncertain behavior of skin: Secondary | ICD-10-CM | POA: Diagnosis not present

## 2014-08-15 DIAGNOSIS — R8299 Other abnormal findings in urine: Secondary | ICD-10-CM | POA: Diagnosis not present

## 2014-08-15 DIAGNOSIS — N390 Urinary tract infection, site not specified: Secondary | ICD-10-CM | POA: Diagnosis not present

## 2014-08-15 DIAGNOSIS — M810 Age-related osteoporosis without current pathological fracture: Secondary | ICD-10-CM | POA: Diagnosis not present

## 2014-08-15 DIAGNOSIS — R7301 Impaired fasting glucose: Secondary | ICD-10-CM | POA: Diagnosis not present

## 2014-08-15 DIAGNOSIS — E785 Hyperlipidemia, unspecified: Secondary | ICD-10-CM | POA: Diagnosis not present

## 2014-08-15 DIAGNOSIS — D510 Vitamin B12 deficiency anemia due to intrinsic factor deficiency: Secondary | ICD-10-CM | POA: Diagnosis not present

## 2014-08-15 DIAGNOSIS — Z125 Encounter for screening for malignant neoplasm of prostate: Secondary | ICD-10-CM | POA: Diagnosis not present

## 2014-08-22 DIAGNOSIS — Z0000 Encounter for general adult medical examination without abnormal findings: Secondary | ICD-10-CM | POA: Diagnosis not present

## 2014-08-22 DIAGNOSIS — G4762 Sleep related leg cramps: Secondary | ICD-10-CM | POA: Diagnosis not present

## 2014-08-22 DIAGNOSIS — H9190 Unspecified hearing loss, unspecified ear: Secondary | ICD-10-CM | POA: Diagnosis not present

## 2014-08-22 DIAGNOSIS — R300 Dysuria: Secondary | ICD-10-CM | POA: Diagnosis not present

## 2014-08-22 DIAGNOSIS — M810 Age-related osteoporosis without current pathological fracture: Secondary | ICD-10-CM | POA: Diagnosis not present

## 2014-08-22 DIAGNOSIS — N200 Calculus of kidney: Secondary | ICD-10-CM | POA: Diagnosis not present

## 2014-08-22 DIAGNOSIS — Z1389 Encounter for screening for other disorder: Secondary | ICD-10-CM | POA: Diagnosis not present

## 2014-08-22 DIAGNOSIS — D510 Vitamin B12 deficiency anemia due to intrinsic factor deficiency: Secondary | ICD-10-CM | POA: Diagnosis not present

## 2014-08-22 DIAGNOSIS — L209 Atopic dermatitis, unspecified: Secondary | ICD-10-CM | POA: Diagnosis not present

## 2014-08-22 DIAGNOSIS — J45909 Unspecified asthma, uncomplicated: Secondary | ICD-10-CM | POA: Diagnosis not present

## 2014-08-22 DIAGNOSIS — Z6821 Body mass index (BMI) 21.0-21.9, adult: Secondary | ICD-10-CM | POA: Diagnosis not present

## 2014-08-22 DIAGNOSIS — R413 Other amnesia: Secondary | ICD-10-CM | POA: Diagnosis not present

## 2014-08-30 ENCOUNTER — Other Ambulatory Visit (HOSPITAL_COMMUNITY): Payer: Self-pay

## 2014-08-30 ENCOUNTER — Encounter (HOSPITAL_COMMUNITY): Payer: Medicare Other

## 2014-08-31 ENCOUNTER — Ambulatory Visit (HOSPITAL_COMMUNITY)
Admission: RE | Admit: 2014-08-31 | Discharge: 2014-08-31 | Disposition: A | Discharge status: Home / Self Care | Payer: Medicare Other | Source: Ambulatory Visit | Attending: Internal Medicine | Admitting: Internal Medicine

## 2014-08-31 DIAGNOSIS — M810 Age-related osteoporosis without current pathological fracture: Secondary | ICD-10-CM | POA: Diagnosis not present

## 2014-08-31 MED ORDER — DENOSUMAB 60 MG/ML ~~LOC~~ SOLN
60.0000 mg | Freq: Once | SUBCUTANEOUS | Status: AC
  Administered 2014-08-31: 60 mg via SUBCUTANEOUS
  Filled 2014-08-31: qty 1

## 2014-09-02 DIAGNOSIS — H04123 Dry eye syndrome of bilateral lacrimal glands: Secondary | ICD-10-CM | POA: Diagnosis not present

## 2014-09-02 DIAGNOSIS — H2513 Age-related nuclear cataract, bilateral: Secondary | ICD-10-CM | POA: Diagnosis not present

## 2014-09-09 DIAGNOSIS — Z85828 Personal history of other malignant neoplasm of skin: Secondary | ICD-10-CM | POA: Diagnosis not present

## 2014-09-09 DIAGNOSIS — Z85820 Personal history of malignant melanoma of skin: Secondary | ICD-10-CM | POA: Diagnosis not present

## 2014-09-09 DIAGNOSIS — L2089 Other atopic dermatitis: Secondary | ICD-10-CM | POA: Diagnosis not present

## 2014-09-13 DIAGNOSIS — Z6821 Body mass index (BMI) 21.0-21.9, adult: Secondary | ICD-10-CM | POA: Diagnosis not present

## 2014-09-13 DIAGNOSIS — Z23 Encounter for immunization: Secondary | ICD-10-CM | POA: Diagnosis not present

## 2014-09-13 DIAGNOSIS — R413 Other amnesia: Secondary | ICD-10-CM | POA: Diagnosis not present

## 2014-10-31 DIAGNOSIS — N401 Enlarged prostate with lower urinary tract symptoms: Secondary | ICD-10-CM | POA: Diagnosis not present

## 2014-11-03 DIAGNOSIS — N5201 Erectile dysfunction due to arterial insufficiency: Secondary | ICD-10-CM | POA: Diagnosis not present

## 2014-11-03 DIAGNOSIS — N401 Enlarged prostate with lower urinary tract symptoms: Secondary | ICD-10-CM | POA: Diagnosis not present

## 2014-11-03 DIAGNOSIS — R8271 Bacteriuria: Secondary | ICD-10-CM | POA: Diagnosis not present

## 2014-11-03 DIAGNOSIS — R3915 Urgency of urination: Secondary | ICD-10-CM | POA: Diagnosis not present

## 2014-12-20 DIAGNOSIS — H903 Sensorineural hearing loss, bilateral: Secondary | ICD-10-CM | POA: Diagnosis not present

## 2015-01-26 DIAGNOSIS — Z85828 Personal history of other malignant neoplasm of skin: Secondary | ICD-10-CM | POA: Diagnosis not present

## 2015-01-26 DIAGNOSIS — Z85820 Personal history of malignant melanoma of skin: Secondary | ICD-10-CM | POA: Diagnosis not present

## 2015-01-26 DIAGNOSIS — D1801 Hemangioma of skin and subcutaneous tissue: Secondary | ICD-10-CM | POA: Diagnosis not present

## 2015-01-26 DIAGNOSIS — L2089 Other atopic dermatitis: Secondary | ICD-10-CM | POA: Diagnosis not present

## 2015-01-26 DIAGNOSIS — L821 Other seborrheic keratosis: Secondary | ICD-10-CM | POA: Diagnosis not present

## 2015-01-26 DIAGNOSIS — D225 Melanocytic nevi of trunk: Secondary | ICD-10-CM | POA: Diagnosis not present

## 2015-03-13 DIAGNOSIS — M810 Age-related osteoporosis without current pathological fracture: Secondary | ICD-10-CM | POA: Diagnosis not present

## 2015-03-13 DIAGNOSIS — N400 Enlarged prostate without lower urinary tract symptoms: Secondary | ICD-10-CM | POA: Diagnosis not present

## 2015-03-13 DIAGNOSIS — R413 Other amnesia: Secondary | ICD-10-CM | POA: Diagnosis not present

## 2015-03-13 DIAGNOSIS — N200 Calculus of kidney: Secondary | ICD-10-CM | POA: Diagnosis not present

## 2015-03-13 DIAGNOSIS — Z6822 Body mass index (BMI) 22.0-22.9, adult: Secondary | ICD-10-CM | POA: Diagnosis not present

## 2015-03-13 DIAGNOSIS — R7301 Impaired fasting glucose: Secondary | ICD-10-CM | POA: Diagnosis not present

## 2015-03-16 ENCOUNTER — Encounter: Payer: Self-pay | Admitting: Internal Medicine

## 2015-04-11 DIAGNOSIS — Z0100 Encounter for examination of eyes and vision without abnormal findings: Secondary | ICD-10-CM | POA: Diagnosis not present

## 2015-04-11 DIAGNOSIS — H2513 Age-related nuclear cataract, bilateral: Secondary | ICD-10-CM | POA: Diagnosis not present

## 2015-04-11 DIAGNOSIS — H04123 Dry eye syndrome of bilateral lacrimal glands: Secondary | ICD-10-CM | POA: Diagnosis not present

## 2015-05-08 ENCOUNTER — Ambulatory Visit (INDEPENDENT_AMBULATORY_CARE_PROVIDER_SITE_OTHER): Payer: Medicare Other | Admitting: Internal Medicine

## 2015-05-08 ENCOUNTER — Encounter: Payer: Self-pay | Admitting: Internal Medicine

## 2015-05-08 VITALS — BP 160/90 | HR 66 | Ht 68.0 in | Wt 144.2 lb

## 2015-05-08 DIAGNOSIS — K219 Gastro-esophageal reflux disease without esophagitis: Secondary | ICD-10-CM

## 2015-05-08 DIAGNOSIS — Z85038 Personal history of other malignant neoplasm of large intestine: Secondary | ICD-10-CM | POA: Diagnosis not present

## 2015-05-08 MED ORDER — NA SULFATE-K SULFATE-MG SULF 17.5-3.13-1.6 GM/177ML PO SOLN
1.0000 | Freq: Once | ORAL | Status: DC
Start: 2015-05-08 — End: 2015-06-14

## 2015-05-08 NOTE — Progress Notes (Signed)
HISTORY OF PRESENT ILLNESS:  Charles Mitchell is a 82 y.o. male who presents with a chief complaint of needing surveillance colonoscopy and management of chronic GERD. The patient has a history of rectal cancer in 2005. Follow-up colonoscopies in 2006, 2007, and most recently 2011 (severe diverticulosis, lipoma in transverse colon, otherwise normal). He remains in great shape and quite active. Goes to the gym daily. Doing well after surgery for BPH. Takes pantoprazole 40 mg every other day for GERD. Good control with symptoms. No dysphagia. GI review of systems otherwise negative  REVIEW OF SYSTEMS:  All non-GI ROS negative upon comprehensive review of all systems  Past Medical History  Diagnosis Date  . COPD (chronic obstructive pulmonary disease) ([place])   . Hyperlipidemia   . GERD (gastroesophageal reflux disease)   . Diverticulosis of colon     severe  . History of rectal cancer     01-24-2004  s/p  excision rectal cancer--  no recurrence  . BPH (benign prostatic hypertrophy) with urinary retention   . Foley catheter in place   . History of melanoma excision     skin  . Wears hearing aid     bilateral  . Eczema   . History of adenomatous polyp of colon     Past Surgical History  Procedure Laterality Date  . Transanal excision rectal cancer/  hemorrhoidectomy  01-24-2004  . Colonoscopy  last one 07-06-2009  . Green light laser turp (transurethral resection of prostate N/A 04/19/2014    Procedure: GREEN LIGHT LASER TURP (TRANSURETHRAL RESECTION OF PROSTATE;  Surgeon: Festus Aloe, MD;  [facility];  Service: Urology;  Laterality: N/A;    Social History [place]  reports that he quit smoking about 32 years ago. His smoking use included Cigarettes. He quit after 30 years of use. He has never used smokeless tobacco. He reports that he does not drink alcohol or use illicit drugs.  family history is not on file.  Allergies  Allergen Reactions  .  Sulfa Antibiotics Other (See Comments)    Unknown childhood reaction       PHYSICAL EXAMINATION: Vital signs: BP 160/90 mmHg  Pulse 66  Ht 5\' 8"  (1.727 m)  Wt 144 lb 3.2 oz (65.409 kg)  BMI 21.93 kg/m2  Constitutional: generally well-appearing, no acute distress Psychiatric: alert and oriented x3, cooperative Eyes: extraocular movements intact, anicteric, conjunctiva pink Mouth: oral pharynx moist, no lesions Neck: supple no lymphadenopathy Cardiovascular: heart regular rate and rhythm, no murmur Lungs: clear to auscultation bilaterally Abdomen: soft, nontender, nondistended, no obvious ascites, no peritoneal signs, normal bowel sounds, no organomegaly Rectal: Deferred until upcoming colonoscopy Extremities: no clubbing cyanosis or lower extremity edema bilaterally Skin: no lesions on visible extremities Neuro: No focal deficits. Normal DTRs. Cranial nerves intact  ASSESSMENT:  #1. Personal history of rectal cancer. Due for surveillance colonoscopy. Appropriate candidate without contraindication #2. Chronic GERD without alarm features. Symptoms managed with every other day PPI   PLAN:  #1. Reflux precautions #2. Continue with lowest dose of PPI to control symptoms #3. Surveillance colonoscopy.The nature of the procedure, as well as the risks, benefits, and alternatives were carefully and thoroughly reviewed with the patient. Ample time for discussion and questions allowed. The patient understood, was satisfied, and agreed to proceed.

## 2015-05-08 NOTE — Patient Instructions (Signed)
You have been scheduled for a colonoscopy. Please follow written instructions given to you at your visit today.  Please pick up your prep supplies at the pharmacy within the next 1-3 days. If you use inhalers (even only as needed), please bring them with you on the day of your procedure.   

## 2015-06-14 ENCOUNTER — Encounter: Payer: Self-pay | Admitting: Internal Medicine

## 2015-06-14 ENCOUNTER — Ambulatory Visit (AMBULATORY_SURGERY_CENTER): Payer: Medicare Other | Admitting: Internal Medicine

## 2015-06-14 VITALS — BP 141/70 | HR 58 | Temp 96.8°F | Resp 14 | Ht 68.0 in | Wt 144.0 lb

## 2015-06-14 DIAGNOSIS — D123 Benign neoplasm of transverse colon: Secondary | ICD-10-CM

## 2015-06-14 DIAGNOSIS — Z86010 Personal history of colonic polyps: Secondary | ICD-10-CM | POA: Diagnosis not present

## 2015-06-14 DIAGNOSIS — D122 Benign neoplasm of ascending colon: Secondary | ICD-10-CM

## 2015-06-14 DIAGNOSIS — Z85038 Personal history of other malignant neoplasm of large intestine: Secondary | ICD-10-CM

## 2015-06-14 DIAGNOSIS — J449 Chronic obstructive pulmonary disease, unspecified: Secondary | ICD-10-CM | POA: Diagnosis not present

## 2015-06-14 MED ORDER — SODIUM CHLORIDE 0.9 % IV SOLN: 500.0000 mL | INTRAVENOUS | Status: DC

## 2015-06-14 NOTE — Progress Notes (Signed)
Called to room to assist during endoscopic procedure.  Patient ID and intended procedure confirmed with present staff. Received instructions for my participation in the procedure from the performing physician.  

## 2015-06-14 NOTE — Addendum Note (Signed)
Addended by: Delos Haring on: 06/14/2015 02:37 PM   Modules accepted: Orders

## 2015-06-14 NOTE — Op Note (Signed)
[place] Patient Name: Charles Mitchell Procedure Date: 06/14/2015 12:36 PM MRN: [id number] Endoscopist: Docia Chuck. Henrene Pastor , MD Age: 82 Referring MD:  Date of Birth: 02/25/1931 Gender: Male Procedure:                Colonoscopy, with snare polypectomy Indications:              High risk colon cancer surveillance: Personal                            history of colon cancer. Rectal cancer 2005. The                            examinations 2006, 2007, and 2011 without neoplasia. Medicines:                Monitored Anesthesia Care Procedure:                Pre-Anesthesia Assessment:                           - Prior to the procedure, a History and Physical                            was performed, and patient medications and                            allergies were reviewed. The patient's tolerance of                            previous anesthesia was also reviewed. The risks                            and benefits of the procedure and the sedation                            options and risks were discussed with the patient.                            All questions were answered, and informed consent                            was obtained. Prior Anticoagulants: The patient has                            taken no previous anticoagulant or antiplatelet                            agents. ASA Grade Assessment: II - A patient with                            mild systemic disease. After reviewing the risks                            and benefits, the patient was deemed in  satisfactory condition to undergo the procedure.                           After obtaining informed consent, the colonoscope                            was passed under direct vision. Throughout the                            procedure, the patient's blood pressure, pulse, and                            oxygen saturations were monitored continuously. The                            Model  [id number]-[id number]) scope was introduced                            through the anus and advanced to the the cecum,                            identified by appendiceal orifice and ileocecal                            valve. The ileocecal valve, appendiceal orifice,                            and rectum were photographed. The quality of the                            bowel preparation was excellent. The colonoscopy                            was performed without difficulty. The patient                            tolerated the procedure well. The bowel preparation                            used was SUPREP. Scope In: 1:20:03 PM Scope Out: 1:40:06 PM Scope Withdrawal Time: 0 hours 12 minutes 8 seconds  Total Procedure Duration: 0 hours 20 minutes 3 seconds  Findings:                 Four polyps were found in the transverse colon and                            ascending colon. The polyps were 3 to 6 mm in size.                            These polyps were removed with a cold snare.                            Resection and retrieval were complete.  Multiple large-mouthed diverticula were found in                            the entire colon.                           Internal hemorrhoids were found during retroflexion.                           Incidental proximal transverse colon lipoma.The                            exam was otherwise without abnormality on direct                            and retroflexion views. Complications:            No immediate complications. Estimated blood loss:                            None. Estimated Blood Loss:     Estimated blood loss: none. Impression:               - Four 3 to 6 mm polyps in the transverse colon and                            in the ascending colon, removed with a cold snare.                            Resected and retrieved.                           - Diverticulosis in the entire examined colon.                            - Internal hemorrhoids an incidental lipoma.                           - The examination was otherwise normal on direct                            and retroflexion views. Recommendation:           - Repeat colonoscopy is not recommended for                            surveillance.                           - Patient has a contact number available for                            emergencies. The signs and symptoms of potential                            delayed complications were discussed with the  patient. Return to normal activities tomorrow.                            Written discharge instructions were provided to the                            patient.                           - Resume previous diet.                           - Continue present medications.                           - Await pathology results. Docia Chuck. Henrene Pastor, MD 06/14/2015 1:49:15 PM This report has been signed electronically. CC Letter to:             [employer]

## 2015-06-14 NOTE — Progress Notes (Signed)
A/ox3, pleased with MAC, report to RN 

## 2015-06-14 NOTE — Patient Instructions (Signed)
Discharge instructions given. Handouts on polyps,diverticulosis and hemorrhoids. Resume previous medications. YOU HAD AN ENDOSCOPIC PROCEDURE TODAY AT THE [facility]:   Refer to the procedure report that was given to you for any specific questions about what was found during the examination.  If the procedure report does not answer your questions, please call your gastroenterologist to clarify.  If you requested that your care partner not be given the details of your procedure findings, then the procedure report has been included in a sealed envelope for you to review at your convenience later.  YOU SHOULD EXPECT: Some feelings of bloating in the abdomen. Passage of more gas than usual.  Walking can help get rid of the air that was put into your GI tract during the procedure and reduce the bloating. If you had a lower endoscopy (such as a colonoscopy or flexible sigmoidoscopy) you may notice spotting of blood in your stool or on the toilet paper. If you underwent a bowel prep for your procedure, you may not have a normal bowel movement for a few days.  Please Note:  You might notice some irritation and congestion in your nose or some drainage.  This is from the oxygen used during your procedure.  There is no need for concern and it should clear up in a day or so.  SYMPTOMS TO REPORT IMMEDIATELY:   Following lower endoscopy (colonoscopy or flexible sigmoidoscopy):  Excessive amounts of blood in the stool  Significant tenderness or worsening of abdominal pains  Swelling of the abdomen that is new, acute  Fever of 100F or higher   For urgent or emergent issues, a gastroenterologist can be reached at any hour by calling [phone].   DIET: Your first meal following the procedure should be a small meal and then it is ok to progress to your normal diet. Heavy or fried foods are harder to digest and may make you feel nauseous or bloated.  Likewise, meals heavy in dairy and  vegetables can increase bloating.  Drink plenty of fluids but you should avoid alcoholic beverages for 24 hours.  ACTIVITY:  You should plan to take it easy for the rest of today and you should NOT DRIVE or use heavy machinery until tomorrow (because of the sedation medicines used during the test).    FOLLOW UP: Our staff will call the number listed on your records the next business day following your procedure to check on you and address any questions or concerns that you may have regarding the information given to you following your procedure. If we do not reach you, we will leave a message.  However, if you are feeling well and you are not experiencing any problems, there is no need to return our call.  We will assume that you have returned to your regular daily activities without incident.  If any biopsies were taken you will be contacted by phone or by letter within the next 1-3 weeks.  Please call us at [phone] if you have not heard about the biopsies in 3 weeks.    SIGNATURES/CONFIDENTIALITY: You and/or your care partner have signed paperwork which will be entered into your electronic medical record.  These signatures attest to the fact that that the information above on your After Visit Summary has been reviewed and is understood.  Full responsibility of the confidentiality of this discharge information lies with you and/or your care-partner. 

## 2015-06-15 ENCOUNTER — Telehealth: Payer: Self-pay

## 2015-06-15 NOTE — Telephone Encounter (Signed)
  Follow up Call-  Call back number 06/14/2015  Post procedure Call Back phone  # [phone]  Permission to leave phone message Yes     Patient questions:  Do you have a fever, pain , or abdominal swelling? No. Pain Score  0 *  Have you tolerated food without any problems? Yes.    Have you been able to return to your normal activities? Yes.    Do you have any questions about your discharge instructions: Diet   No. Medications  No. Follow up visit  No.  Do you have questions or concerns about your Care? No.  Actions: * If pain score is 4 or above: No action needed, pain <4.

## 2015-06-20 ENCOUNTER — Encounter: Payer: Self-pay | Admitting: Internal Medicine

## 2015-07-27 DIAGNOSIS — L821 Other seborrheic keratosis: Secondary | ICD-10-CM | POA: Diagnosis not present

## 2015-07-27 DIAGNOSIS — Z85828 Personal history of other malignant neoplasm of skin: Secondary | ICD-10-CM | POA: Diagnosis not present

## 2015-07-27 DIAGNOSIS — L820 Inflamed seborrheic keratosis: Secondary | ICD-10-CM | POA: Diagnosis not present

## 2015-07-27 DIAGNOSIS — Z85820 Personal history of malignant melanoma of skin: Secondary | ICD-10-CM | POA: Diagnosis not present

## 2015-07-27 DIAGNOSIS — D1801 Hemangioma of skin and subcutaneous tissue: Secondary | ICD-10-CM | POA: Diagnosis not present

## 2015-07-27 DIAGNOSIS — D485 Neoplasm of uncertain behavior of skin: Secondary | ICD-10-CM | POA: Diagnosis not present

## 2015-09-06 DIAGNOSIS — Z125 Encounter for screening for malignant neoplasm of prostate: Secondary | ICD-10-CM | POA: Diagnosis not present

## 2015-09-06 DIAGNOSIS — E538 Deficiency of other specified B group vitamins: Secondary | ICD-10-CM | POA: Diagnosis not present

## 2015-09-06 DIAGNOSIS — M810 Age-related osteoporosis without current pathological fracture: Secondary | ICD-10-CM | POA: Diagnosis not present

## 2015-09-06 DIAGNOSIS — E784 Other hyperlipidemia: Secondary | ICD-10-CM | POA: Diagnosis not present

## 2015-09-06 DIAGNOSIS — D510 Vitamin B12 deficiency anemia due to intrinsic factor deficiency: Secondary | ICD-10-CM | POA: Diagnosis not present

## 2015-09-06 DIAGNOSIS — R7301 Impaired fasting glucose: Secondary | ICD-10-CM | POA: Diagnosis not present

## 2015-09-13 DIAGNOSIS — Z0000 Encounter for general adult medical examination without abnormal findings: Secondary | ICD-10-CM | POA: Diagnosis not present

## 2015-09-13 DIAGNOSIS — J45909 Unspecified asthma, uncomplicated: Secondary | ICD-10-CM | POA: Diagnosis not present

## 2015-09-13 DIAGNOSIS — N200 Calculus of kidney: Secondary | ICD-10-CM | POA: Diagnosis not present

## 2015-09-13 DIAGNOSIS — C20 Malignant neoplasm of rectum: Secondary | ICD-10-CM | POA: Diagnosis not present

## 2015-09-13 DIAGNOSIS — N400 Enlarged prostate without lower urinary tract symptoms: Secondary | ICD-10-CM | POA: Diagnosis not present

## 2015-09-13 DIAGNOSIS — M810 Age-related osteoporosis without current pathological fracture: Secondary | ICD-10-CM | POA: Diagnosis not present

## 2015-09-13 DIAGNOSIS — E784 Other hyperlipidemia: Secondary | ICD-10-CM | POA: Diagnosis not present

## 2015-09-13 DIAGNOSIS — H9193 Unspecified hearing loss, bilateral: Secondary | ICD-10-CM | POA: Diagnosis not present

## 2015-09-13 DIAGNOSIS — Z6822 Body mass index (BMI) 22.0-22.9, adult: Secondary | ICD-10-CM | POA: Diagnosis not present

## 2015-09-13 DIAGNOSIS — Z23 Encounter for immunization: Secondary | ICD-10-CM | POA: Diagnosis not present

## 2015-09-13 DIAGNOSIS — D510 Vitamin B12 deficiency anemia due to intrinsic factor deficiency: Secondary | ICD-10-CM | POA: Diagnosis not present

## 2015-09-13 DIAGNOSIS — R7301 Impaired fasting glucose: Secondary | ICD-10-CM | POA: Diagnosis not present

## 2015-09-26 DIAGNOSIS — M810 Age-related osteoporosis without current pathological fracture: Secondary | ICD-10-CM | POA: Diagnosis not present

## 2015-10-12 ENCOUNTER — Other Ambulatory Visit (HOSPITAL_COMMUNITY): Payer: Self-pay | Admitting: *Deleted

## 2015-10-13 ENCOUNTER — Ambulatory Visit (HOSPITAL_COMMUNITY)
Admission: RE | Admit: 2015-10-13 | Discharge: 2015-10-13 | Disposition: A | Discharge status: Home / Self Care | Payer: Medicare Other | Source: Ambulatory Visit | Attending: Internal Medicine | Admitting: Internal Medicine

## 2015-10-13 DIAGNOSIS — M810 Age-related osteoporosis without current pathological fracture: Secondary | ICD-10-CM | POA: Insufficient documentation

## 2015-10-13 MED ORDER — DENOSUMAB 60 MG/ML ~~LOC~~ SOLN
60.0000 mg | Freq: Once | SUBCUTANEOUS | Status: AC
  Administered 2015-10-13: 60 mg via SUBCUTANEOUS
  Filled 2015-10-13: qty 1

## 2015-11-28 DIAGNOSIS — N5201 Erectile dysfunction due to arterial insufficiency: Secondary | ICD-10-CM | POA: Diagnosis not present

## 2015-11-28 DIAGNOSIS — R3915 Urgency of urination: Secondary | ICD-10-CM | POA: Diagnosis not present

## 2015-11-28 DIAGNOSIS — N401 Enlarged prostate with lower urinary tract symptoms: Secondary | ICD-10-CM | POA: Diagnosis not present

## 2016-02-02 DIAGNOSIS — Z85820 Personal history of malignant melanoma of skin: Secondary | ICD-10-CM | POA: Diagnosis not present

## 2016-02-02 DIAGNOSIS — D0359 Melanoma in situ of other part of trunk: Secondary | ICD-10-CM | POA: Diagnosis not present

## 2016-02-02 DIAGNOSIS — D2272 Melanocytic nevi of left lower limb, including hip: Secondary | ICD-10-CM | POA: Diagnosis not present

## 2016-02-02 DIAGNOSIS — D2271 Melanocytic nevi of right lower limb, including hip: Secondary | ICD-10-CM | POA: Diagnosis not present

## 2016-02-02 DIAGNOSIS — L2089 Other atopic dermatitis: Secondary | ICD-10-CM | POA: Diagnosis not present

## 2016-02-02 DIAGNOSIS — L821 Other seborrheic keratosis: Secondary | ICD-10-CM | POA: Diagnosis not present

## 2016-02-02 DIAGNOSIS — Z85828 Personal history of other malignant neoplasm of skin: Secondary | ICD-10-CM | POA: Diagnosis not present

## 2016-02-02 DIAGNOSIS — D225 Melanocytic nevi of trunk: Secondary | ICD-10-CM | POA: Diagnosis not present

## 2016-02-02 DIAGNOSIS — D485 Neoplasm of uncertain behavior of skin: Secondary | ICD-10-CM | POA: Diagnosis not present

## 2016-02-02 DIAGNOSIS — L812 Freckles: Secondary | ICD-10-CM | POA: Diagnosis not present

## 2016-02-27 DIAGNOSIS — D0359 Melanoma in situ of other part of trunk: Secondary | ICD-10-CM | POA: Diagnosis not present

## 2016-02-27 DIAGNOSIS — L988 Other specified disorders of the skin and subcutaneous tissue: Secondary | ICD-10-CM | POA: Diagnosis not present

## 2016-02-27 DIAGNOSIS — Z85820 Personal history of malignant melanoma of skin: Secondary | ICD-10-CM | POA: Diagnosis not present

## 2016-02-27 DIAGNOSIS — Z85828 Personal history of other malignant neoplasm of skin: Secondary | ICD-10-CM | POA: Diagnosis not present

## 2016-04-16 DIAGNOSIS — H5203 Hypermetropia, bilateral: Secondary | ICD-10-CM | POA: Diagnosis not present

## 2016-04-16 DIAGNOSIS — H2513 Age-related nuclear cataract, bilateral: Secondary | ICD-10-CM | POA: Diagnosis not present

## 2016-05-03 DIAGNOSIS — K090 Developmental odontogenic cysts: Secondary | ICD-10-CM | POA: Diagnosis not present

## 2016-05-23 IMAGING — CT CT ABD-PELV W/O CM
3 of 4 series · 9 of 46 positions shown, 16 images · non-contrast
Comparison: CT 02/27/2012

CLINICAL DATA: Fever of unknown origin.  History of.

EXAM:
CT ABDOMEN AND PELVIS WITHOUT CONTRAST
TECHNIQUE: Multidetector CT imaging of the abdomen and pelvis was performed
following the standard protocol without IV contrast.

[Series 3: lung windows · axial · 0.62mm/px · z∈[-106,-21]mm · 5 of 27 slices shown, 10 images]
[im 5/27  soft-tissue]
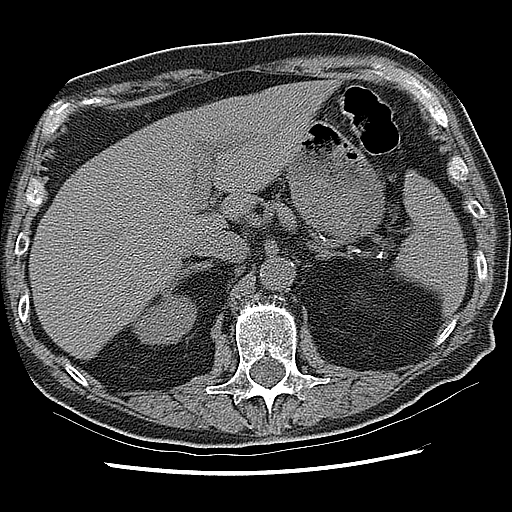
[im 5/27  bone]
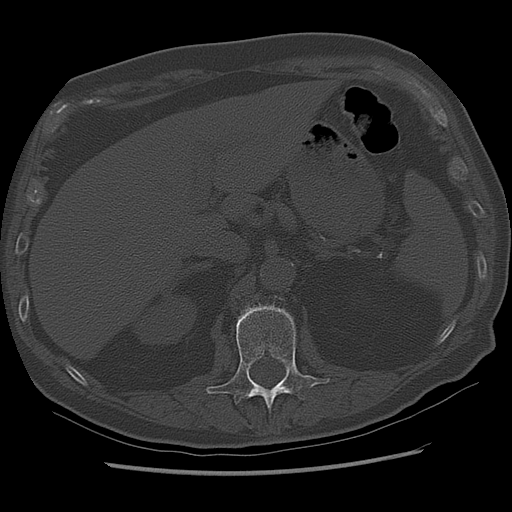
[im 9/27  soft-tissue]
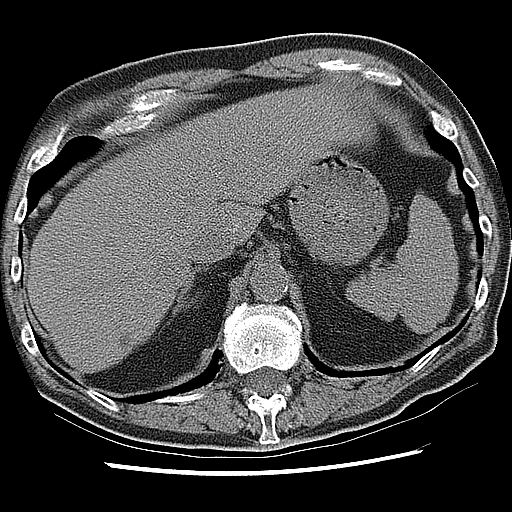
[im 9/27  lung]
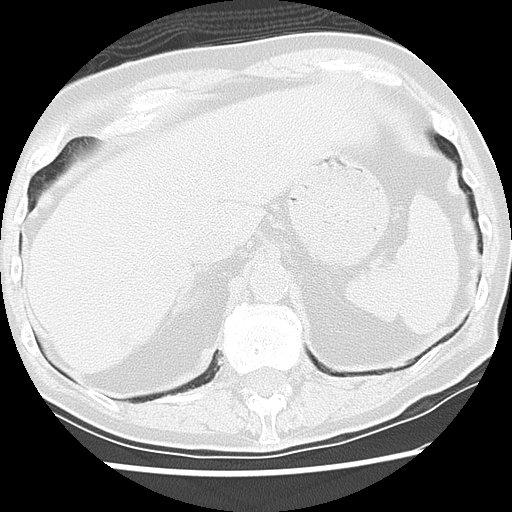
[im 14/27  soft-tissue]
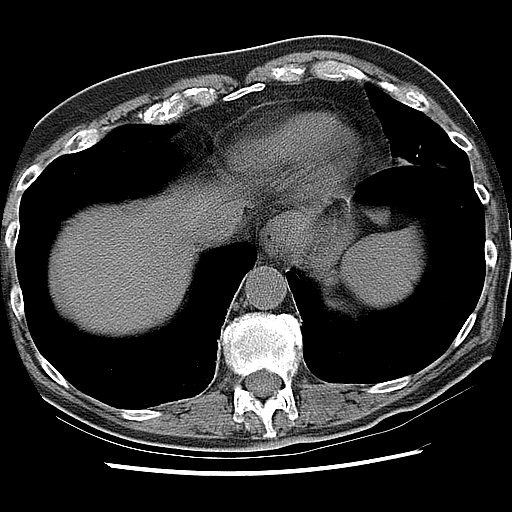
[im 14/27  lung]
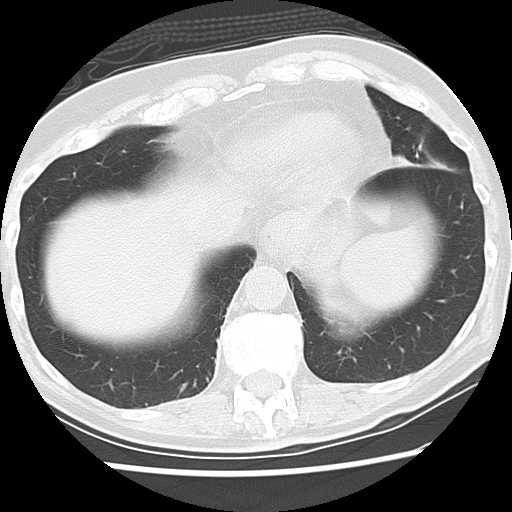
[im 18/27  soft-tissue]
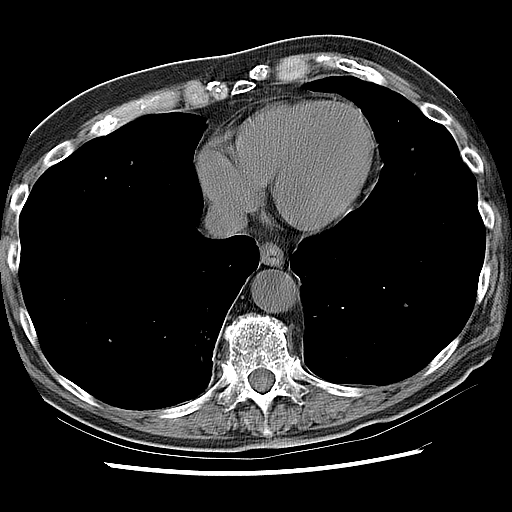
[im 18/27  lung]
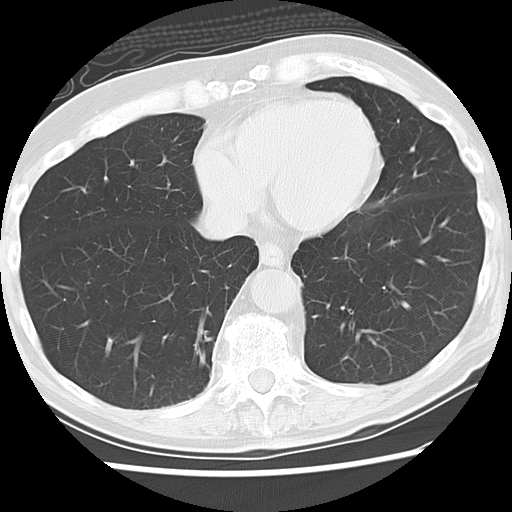
[im 22/27  soft-tissue]
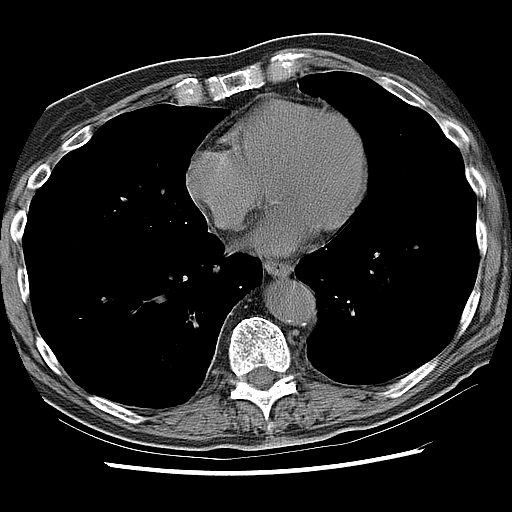
[im 22/27  lung]
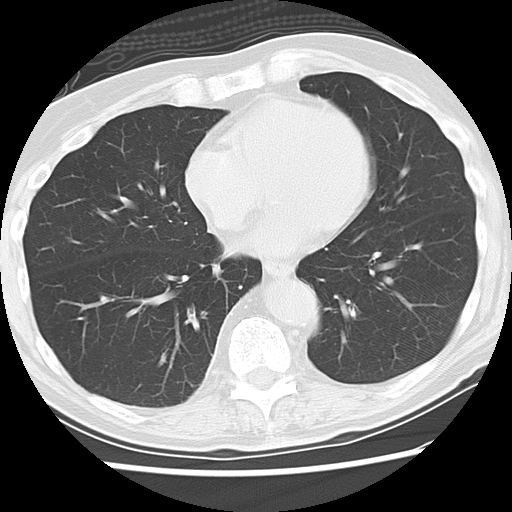

[Series 400: cor · coronal · 0.90mm/px · 3 of 134 slices shown, 4 images]
[im 45/134  soft-tissue]
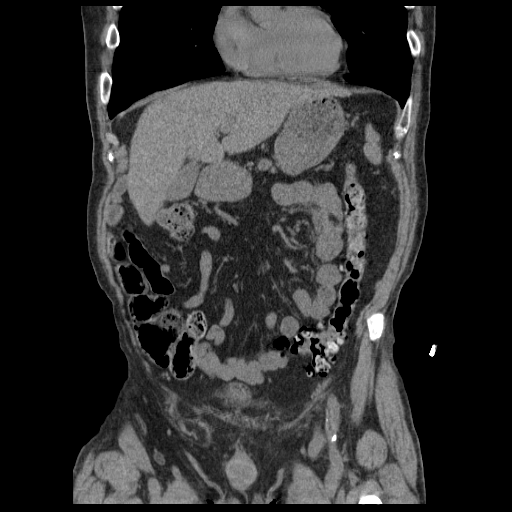
[im 60/134  soft-tissue]
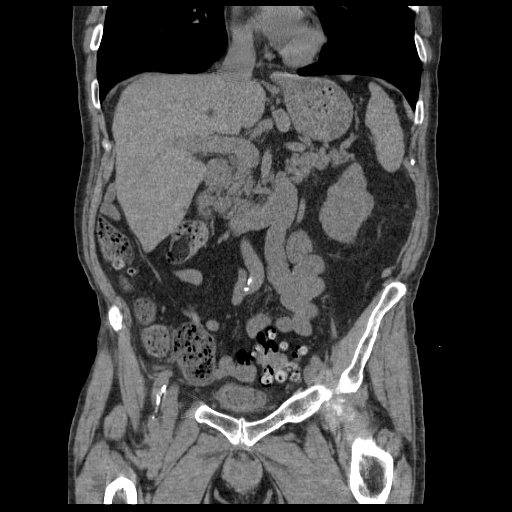
[im 60/134  bone]
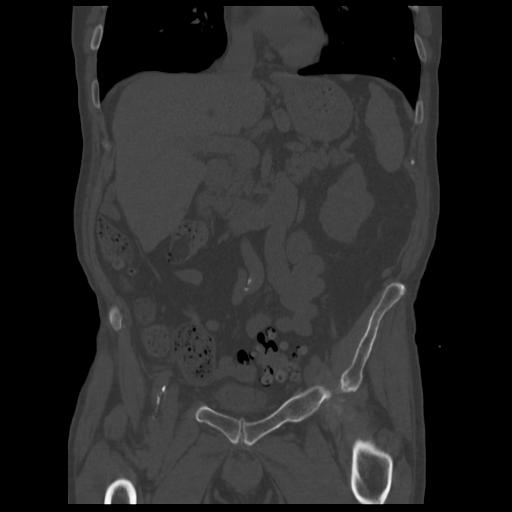
[im 74/134  soft-tissue]
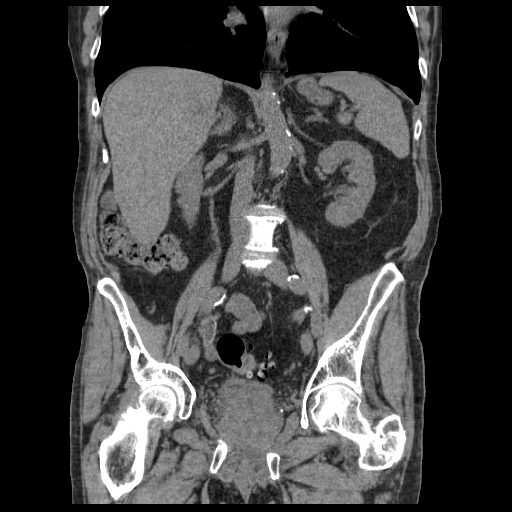

[Series 401: sag · sagittal · 0.90mm/px · 1 of 170 slices shown, 2 images]
[im 57/170  soft-tissue]
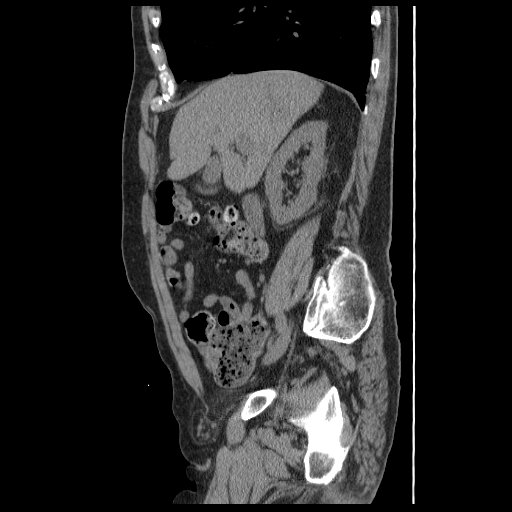
[im 57/170  bone]
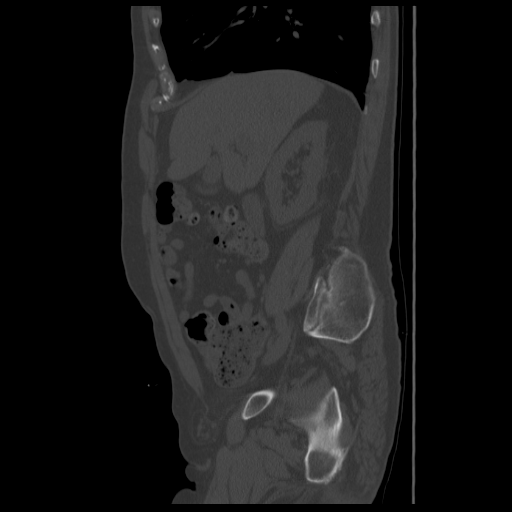

[9 of 46 positions shown; findings below may reference images not displayed]

FINDINGS: Lung bases are clear.  No effusions.  Heart is normal size.

Small scattered low-density areas within the liver are stable since
prior study compatible with small cysts. Exophytic lesion off the
posterior aspect of the liver measures 2.5 cm a is stable since
prior study when this was shown toe represent a hemangioma. Spleen,
pancreas, adrenals and kidneys have an unremarkable unenhanced
appearance. No renal stones. No ureteral stones or hydronephrosis.
Urinary bladder is decompressed. Prostate is enlarged.

Diffuse colonic diverticulosis, most pronounced in the sigmoid
colon. No active diverticulitis. Stomach and small bowel are
decompressed and unremarkable. No free fluid, free air or
adenopathy. Aorta is tortuous and calcified, non aneurysmal.

No acute bony abnormality or focal bone lesion.
IMPRESSION: No explanation for the patient's fever.  No acute findings.

Diffuse colonic diverticulosis.  No active diverticulitis.

No renal or ureteral stones.  No hydronephrosis.

Prostate enlargement.

## 2016-08-13 DIAGNOSIS — H903 Sensorineural hearing loss, bilateral: Secondary | ICD-10-CM | POA: Diagnosis not present

## 2016-10-23 DIAGNOSIS — Z125 Encounter for screening for malignant neoplasm of prostate: Secondary | ICD-10-CM | POA: Diagnosis not present

## 2016-10-23 DIAGNOSIS — R7301 Impaired fasting glucose: Secondary | ICD-10-CM | POA: Diagnosis not present

## 2016-10-23 DIAGNOSIS — Z0000 Encounter for general adult medical examination without abnormal findings: Secondary | ICD-10-CM | POA: Diagnosis not present

## 2016-10-23 DIAGNOSIS — E7849 Other hyperlipidemia: Secondary | ICD-10-CM | POA: Diagnosis not present

## 2016-10-23 DIAGNOSIS — D510 Vitamin B12 deficiency anemia due to intrinsic factor deficiency: Secondary | ICD-10-CM | POA: Diagnosis not present

## 2016-10-30 DIAGNOSIS — N400 Enlarged prostate without lower urinary tract symptoms: Secondary | ICD-10-CM | POA: Diagnosis not present

## 2016-10-30 DIAGNOSIS — J45998 Other asthma: Secondary | ICD-10-CM | POA: Diagnosis not present

## 2016-10-30 DIAGNOSIS — H9193 Unspecified hearing loss, bilateral: Secondary | ICD-10-CM | POA: Diagnosis not present

## 2016-10-30 DIAGNOSIS — Z6821 Body mass index (BMI) 21.0-21.9, adult: Secondary | ICD-10-CM | POA: Diagnosis not present

## 2016-10-30 DIAGNOSIS — D510 Vitamin B12 deficiency anemia due to intrinsic factor deficiency: Secondary | ICD-10-CM | POA: Diagnosis not present

## 2016-10-30 DIAGNOSIS — R300 Dysuria: Secondary | ICD-10-CM | POA: Diagnosis not present

## 2016-10-30 DIAGNOSIS — Z23 Encounter for immunization: Secondary | ICD-10-CM | POA: Diagnosis not present

## 2016-10-30 DIAGNOSIS — M810 Age-related osteoporosis without current pathological fracture: Secondary | ICD-10-CM | POA: Diagnosis not present

## 2016-10-30 DIAGNOSIS — Z1389 Encounter for screening for other disorder: Secondary | ICD-10-CM | POA: Diagnosis not present

## 2016-10-30 DIAGNOSIS — N200 Calculus of kidney: Secondary | ICD-10-CM | POA: Diagnosis not present

## 2016-10-30 DIAGNOSIS — N401 Enlarged prostate with lower urinary tract symptoms: Secondary | ICD-10-CM | POA: Diagnosis not present

## 2016-10-30 DIAGNOSIS — C20 Malignant neoplasm of rectum: Secondary | ICD-10-CM | POA: Diagnosis not present

## 2016-10-30 DIAGNOSIS — Z0000 Encounter for general adult medical examination without abnormal findings: Secondary | ICD-10-CM | POA: Diagnosis not present

## 2016-10-30 DIAGNOSIS — R413 Other amnesia: Secondary | ICD-10-CM | POA: Diagnosis not present

## 2016-11-04 DIAGNOSIS — Z1212 Encounter for screening for malignant neoplasm of rectum: Secondary | ICD-10-CM | POA: Diagnosis not present

## 2017-03-12 DIAGNOSIS — D510 Vitamin B12 deficiency anemia due to intrinsic factor deficiency: Secondary | ICD-10-CM | POA: Diagnosis not present

## 2017-04-15 DIAGNOSIS — H5203 Hypermetropia, bilateral: Secondary | ICD-10-CM | POA: Diagnosis not present

## 2017-04-15 DIAGNOSIS — H2513 Age-related nuclear cataract, bilateral: Secondary | ICD-10-CM | POA: Diagnosis not present

## 2017-04-23 DIAGNOSIS — H2512 Age-related nuclear cataract, left eye: Secondary | ICD-10-CM | POA: Diagnosis not present

## 2017-04-23 DIAGNOSIS — H25812 Combined forms of age-related cataract, left eye: Secondary | ICD-10-CM | POA: Diagnosis not present

## 2017-06-17 DIAGNOSIS — D510 Vitamin B12 deficiency anemia due to intrinsic factor deficiency: Secondary | ICD-10-CM | POA: Diagnosis not present

## 2017-06-26 DIAGNOSIS — H02054 Trichiasis without entropian left upper eyelid: Secondary | ICD-10-CM | POA: Diagnosis not present

## 2017-08-25 DIAGNOSIS — D1801 Hemangioma of skin and subcutaneous tissue: Secondary | ICD-10-CM | POA: Diagnosis not present

## 2017-08-25 DIAGNOSIS — Z85820 Personal history of malignant melanoma of skin: Secondary | ICD-10-CM | POA: Diagnosis not present

## 2017-08-25 DIAGNOSIS — L821 Other seborrheic keratosis: Secondary | ICD-10-CM | POA: Diagnosis not present

## 2017-08-25 DIAGNOSIS — L812 Freckles: Secondary | ICD-10-CM | POA: Diagnosis not present

## 2017-08-25 DIAGNOSIS — Z85828 Personal history of other malignant neoplasm of skin: Secondary | ICD-10-CM | POA: Diagnosis not present

## 2017-08-26 DIAGNOSIS — H04123 Dry eye syndrome of bilateral lacrimal glands: Secondary | ICD-10-CM | POA: Diagnosis not present

## 2017-09-16 DIAGNOSIS — D510 Vitamin B12 deficiency anemia due to intrinsic factor deficiency: Secondary | ICD-10-CM | POA: Diagnosis not present

## 2017-09-16 DIAGNOSIS — Z23 Encounter for immunization: Secondary | ICD-10-CM | POA: Diagnosis not present

## 2017-09-19 DIAGNOSIS — H538 Other visual disturbances: Secondary | ICD-10-CM | POA: Diagnosis not present

## 2017-09-19 DIAGNOSIS — R7301 Impaired fasting glucose: Secondary | ICD-10-CM | POA: Diagnosis not present

## 2017-09-19 DIAGNOSIS — Z6820 Body mass index (BMI) 20.0-20.9, adult: Secondary | ICD-10-CM | POA: Diagnosis not present

## 2017-09-19 DIAGNOSIS — H44009 Unspecified purulent endophthalmitis, unspecified eye: Secondary | ICD-10-CM | POA: Diagnosis not present

## 2017-09-19 DIAGNOSIS — R42 Dizziness and giddiness: Secondary | ICD-10-CM | POA: Diagnosis not present

## 2017-09-19 DIAGNOSIS — H268 Other specified cataract: Secondary | ICD-10-CM | POA: Diagnosis not present

## 2017-09-19 DIAGNOSIS — M810 Age-related osteoporosis without current pathological fracture: Secondary | ICD-10-CM | POA: Diagnosis not present

## 2017-09-19 DIAGNOSIS — I440 Atrioventricular block, first degree: Secondary | ICD-10-CM | POA: Diagnosis not present

## 2017-10-24 DIAGNOSIS — H04123 Dry eye syndrome of bilateral lacrimal glands: Secondary | ICD-10-CM | POA: Diagnosis not present

## 2017-11-21 ENCOUNTER — Ambulatory Visit (HOSPITAL_COMMUNITY)
Admission: RE | Admit: 2017-11-21 | Discharge: 2017-11-21 | Disposition: A | Discharge status: Home / Self Care | Payer: Medicare Other | Source: Ambulatory Visit | Attending: Internal Medicine | Admitting: Internal Medicine

## 2017-11-21 DIAGNOSIS — M810 Age-related osteoporosis without current pathological fracture: Secondary | ICD-10-CM | POA: Insufficient documentation

## 2017-11-21 MED ORDER — DENOSUMAB 60 MG/ML ~~LOC~~ SOSY
PREFILLED_SYRINGE | SUBCUTANEOUS | Status: AC
  Administered 2017-11-21: 60 mg via SUBCUTANEOUS
  Filled 2017-11-21: qty 1

## 2017-11-21 MED ORDER — DENOSUMAB 60 MG/ML ~~LOC~~ SOSY
60.0000 mg | PREFILLED_SYRINGE | Freq: Once | SUBCUTANEOUS | Status: AC
  Administered 2017-11-21: 60 mg via SUBCUTANEOUS

## 2017-12-01 DIAGNOSIS — E7849 Other hyperlipidemia: Secondary | ICD-10-CM | POA: Diagnosis not present

## 2017-12-01 DIAGNOSIS — D510 Vitamin B12 deficiency anemia due to intrinsic factor deficiency: Secondary | ICD-10-CM | POA: Diagnosis not present

## 2017-12-01 DIAGNOSIS — M810 Age-related osteoporosis without current pathological fracture: Secondary | ICD-10-CM | POA: Diagnosis not present

## 2017-12-01 DIAGNOSIS — R7301 Impaired fasting glucose: Secondary | ICD-10-CM | POA: Diagnosis not present

## 2017-12-01 DIAGNOSIS — Z125 Encounter for screening for malignant neoplasm of prostate: Secondary | ICD-10-CM | POA: Diagnosis not present

## 2017-12-01 DIAGNOSIS — R82998 Other abnormal findings in urine: Secondary | ICD-10-CM | POA: Diagnosis not present

## 2017-12-08 DIAGNOSIS — J45909 Unspecified asthma, uncomplicated: Secondary | ICD-10-CM | POA: Diagnosis not present

## 2017-12-08 DIAGNOSIS — C20 Malignant neoplasm of rectum: Secondary | ICD-10-CM | POA: Diagnosis not present

## 2017-12-08 DIAGNOSIS — M810 Age-related osteoporosis without current pathological fracture: Secondary | ICD-10-CM | POA: Diagnosis not present

## 2017-12-08 DIAGNOSIS — R413 Other amnesia: Secondary | ICD-10-CM | POA: Diagnosis not present

## 2017-12-08 DIAGNOSIS — J45998 Other asthma: Secondary | ICD-10-CM | POA: Diagnosis not present

## 2017-12-08 DIAGNOSIS — H268 Other specified cataract: Secondary | ICD-10-CM | POA: Diagnosis not present

## 2017-12-08 DIAGNOSIS — R300 Dysuria: Secondary | ICD-10-CM | POA: Diagnosis not present

## 2017-12-08 DIAGNOSIS — Z6820 Body mass index (BMI) 20.0-20.9, adult: Secondary | ICD-10-CM | POA: Diagnosis not present

## 2017-12-08 DIAGNOSIS — H538 Other visual disturbances: Secondary | ICD-10-CM | POA: Diagnosis not present

## 2017-12-08 DIAGNOSIS — Z0000 Encounter for general adult medical examination without abnormal findings: Secondary | ICD-10-CM | POA: Diagnosis not present

## 2017-12-08 DIAGNOSIS — Z1389 Encounter for screening for other disorder: Secondary | ICD-10-CM | POA: Diagnosis not present

## 2017-12-08 DIAGNOSIS — I440 Atrioventricular block, first degree: Secondary | ICD-10-CM | POA: Diagnosis not present

## 2017-12-09 DIAGNOSIS — R3912 Poor urinary stream: Secondary | ICD-10-CM | POA: Diagnosis not present

## 2017-12-09 DIAGNOSIS — N401 Enlarged prostate with lower urinary tract symptoms: Secondary | ICD-10-CM | POA: Diagnosis not present

## 2017-12-09 DIAGNOSIS — N5201 Erectile dysfunction due to arterial insufficiency: Secondary | ICD-10-CM | POA: Diagnosis not present

## 2017-12-09 DIAGNOSIS — R350 Frequency of micturition: Secondary | ICD-10-CM | POA: Diagnosis not present

## 2017-12-11 DIAGNOSIS — Z1212 Encounter for screening for malignant neoplasm of rectum: Secondary | ICD-10-CM | POA: Diagnosis not present

## 2017-12-29 DIAGNOSIS — Z961 Presence of intraocular lens: Secondary | ICD-10-CM | POA: Diagnosis not present

## 2017-12-29 DIAGNOSIS — H11823 Conjunctivochalasis, bilateral: Secondary | ICD-10-CM | POA: Diagnosis not present

## 2017-12-29 DIAGNOSIS — H2511 Age-related nuclear cataract, right eye: Secondary | ICD-10-CM | POA: Diagnosis not present

## 2018-02-05 ENCOUNTER — Other Ambulatory Visit: Payer: Self-pay | Admitting: Urology

## 2018-03-20 ENCOUNTER — Other Ambulatory Visit: Payer: Self-pay

## 2018-03-20 ENCOUNTER — Encounter (HOSPITAL_BASED_OUTPATIENT_CLINIC_OR_DEPARTMENT_OTHER): Payer: Self-pay | Admitting: *Deleted

## 2018-03-20 NOTE — Progress Notes (Addendum)
Spoke w/ pt via phone for pre-op interview.  Pt stated he has been out the country and just got back yesterday 03-19-2018.  Pt stated he went on a cruise that left [place] on 03-02-2018 to grand [place], [place], [place], [place] then docked in [place]. 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  Pt states is fine and does not have any respiratory symptoms.  Pt verbalized understanding I will call him back when I confirm with [place] if his elective surgery should be rescheduled.  ADDENDUM:  I called and spoke with my direct supervisor which is Larence Penning, Surveyor, quantity , informed her about this pt. Sherri got back with me after she spoke with infectious disease of .  They stated at this point it would be pt surgeon to make to call if pt should reschedule or not.  Called and spoke w/ coni, or scheduler for dr eskridge, inform of this pt being returning from out of the country yesterday and find where dr eskridge is to inform him of decision he needs to make.  Coni stated that dr eskridge was in surgery at this time but she send message to call as soon as possible.  ADDENDUM:  Received call back via phone from coni.  Stated per dr Junious Silk will postpone pt surgery for a later date.  Coni stated she will call pt.

## 2018-04-21 ENCOUNTER — Encounter (HOSPITAL_BASED_OUTPATIENT_CLINIC_OR_DEPARTMENT_OTHER): Admission: RE | Payer: Self-pay | Source: Home / Self Care

## 2018-04-21 ENCOUNTER — Ambulatory Visit (HOSPITAL_BASED_OUTPATIENT_CLINIC_OR_DEPARTMENT_OTHER): Admission: RE | Admit: 2018-04-21 | Payer: Medicare Other | Source: Home / Self Care | Admitting: Urology

## 2018-04-21 HISTORY — DX: Presence of external hearing-aid: Z97.4

## 2018-04-21 SURGERY — TURP (TRANSURETHRAL RESECTION OF PROSTATE)
Anesthesia: General

## 2018-05-15 ENCOUNTER — Other Ambulatory Visit: Payer: Self-pay | Admitting: Urology

## 2018-05-21 NOTE — Progress Notes (Signed)
SPOKE W/  _patient via phone     SCREENING SYMPTOMS OF COVID 19:   COUGH--No  RUNNY NOSE---No   SORE THROAT---No  NASAL CONGESTION----No  SNEEZING----No  SHORTNESS OF BREATH---No  DIFFICULTY BREATHING---NO  TEMP >100.0 -----No  UNEXPLAINED BODY ACHES------No  CHILLS --------No   HEADACHES ---------No  LOSS OF SMELL/ TASTE --------No    HAVE YOU OR ANY FAMILY MEMBER TRAVELLED PAST 14 DAYS OUT OF THE   COUNTY---No STATE----No COUNTRY----No  HAVE YOU OR ANY FAMILY MEMBER BEEN EXPOSED TO ANYONE WITH COVID 19? No  Reviewed Visitor Restrictions with patient at present due to Covid 19 with patient verbalizing understanding.

## 2018-05-21 NOTE — Progress Notes (Signed)
CALLED PT TO COMPLETE COVID SCREENING. NO ANSWER. LVMM

## 2018-05-21 NOTE — Patient Instructions (Addendum)
Charles Mitchell  05/21/2018    YOU ARE REQUIRED TO BE TESTED FOR COVID-19 PRIOR TO YOUR SURGERY . YOUR TEST MUST BE COMPLETED ON Monday, May 25, 2018. TESTING IS LOCATED AT [place] ENTRANCE FROM 9:00AM - 3:00PM. FAILURE TO COMPLETE TESTING MAY RESULT IN CANCELLATION OF YOUR SURGERY.   ___________________________________________________________________     Your procedure is scheduled on: 05-28-2018   Report to [facility] Main  Entrance     Report to admitting at 7:30AM     Call this number if you have problems the morning of surgery [phone]     Remember: Do not eat food or drink liquids :After Midnight. BRUSH YOUR TEETH MORNING OF SURGERY AND RINSE YOUR MOUTH OUT, NO CHEWING GUM CANDY OR MINTS.     Take these medicines the morning of surgery with A SIP OF WATER: Pantoprazole if needed, Flovent inhaler if needed                                You may not have any metal on your body including hair pins and              piercings  Do not wear jewelry, make-up, lotions, powders or perfumes, deodorant                          Men may shave face and neck.   Do not bring valuables to the hospital. [place].  Contacts, dentures or bridgework may not be worn into surgery.      Patients discharged the day of surgery will not be allowed to drive home. IF YOU ARE HAVING SURGERY AND GOING HOME THE SAME DAY, YOU MUST HAVE AN ADULT TO DRIVE YOU HOME AND BE WITH YOU FOR 24 HOURS. YOU MAY GO HOME BY TAXI OR UBER OR ORTHERWISE, BUT AN ADULT MUST ACCOMPANY YOU HOME AND STAY WITH YOU FOR 24 HOURS.  Name and phone number of your driver:  Special Instructions: N/A              Please read over the following fact sheets you were given: _____________________________________________________________________             [facility] - Preparing for Surgery Before surgery, you can play  an important role.  Because skin is not sterile, your skin needs to be as free of germs as possible.  You can reduce the number of germs on your skin by washing with CHG (chlorahexidine gluconate) soap before surgery.  CHG is an antiseptic cleaner which kills germs and bonds with the skin to continue killing germs even after washing. Please DO NOT use if you have an allergy to CHG or antibacterial soaps.  If your skin becomes reddened/irritated stop using the CHG and inform your nurse when you arrive at Short Stay. Do not shave (including legs and underarms) for at least 48 hours prior to the first CHG shower.  You may shave your face/neck. Please follow these instructions carefully:  1.  Shower with CHG Soap the night before surgery and the  morning of Surgery.  2.  If you choose to wash your hair, wash your hair first as usual  with your  normal  shampoo.  3.  After you shampoo, rinse your hair and body thoroughly to remove the  shampoo.                           4.  Use CHG as you would any other liquid soap.  You can apply chg directly  to the skin and wash                       Gently with a scrungie or clean washcloth.  5.  Apply the CHG Soap to your body ONLY FROM THE NECK DOWN.   Do not use on face/ open                           Wound or open sores. Avoid contact with eyes, ears mouth and genitals (private parts).                       Wash face,  Genitals (private parts) with your normal soap.             6.  Wash thoroughly, paying special attention to the area where your surgery  will be performed.  7.  Thoroughly rinse your body with warm water from the neck down.  8.  DO NOT shower/wash with your normal soap after using and rinsing off  the CHG Soap.                9.  Pat yourself dry with a clean towel.            10.  Wear clean pajamas.            11.  Place clean sheets on your bed the night of your first shower and do not  sleep with pets. Day of Surgery : Do not apply any  lotions/deodorants the morning of surgery.  Please wear clean clothes to the hospital/surgery center.  FAILURE TO FOLLOW THESE INSTRUCTIONS MAY RESULT IN THE CANCELLATION OF YOUR SURGERY PATIENT SIGNATURE_________________________________  NURSE SIGNATURE__________________________________  ________________________________________________________________________

## 2018-05-22 ENCOUNTER — Other Ambulatory Visit: Payer: Self-pay

## 2018-05-22 ENCOUNTER — Encounter (HOSPITAL_COMMUNITY): Payer: Self-pay

## 2018-05-22 ENCOUNTER — Encounter (HOSPITAL_COMMUNITY)
Admission: RE | Admit: 2018-05-22 | Discharge: 2018-05-22 | Disposition: A | Discharge status: Home / Self Care | Payer: Medicare Other | Source: Ambulatory Visit | Attending: Urology | Admitting: Urology

## 2018-05-22 DIAGNOSIS — Z01812 Encounter for preprocedural laboratory examination: Secondary | ICD-10-CM | POA: Insufficient documentation

## 2018-05-22 DIAGNOSIS — D291 Benign neoplasm of prostate: Secondary | ICD-10-CM | POA: Insufficient documentation

## 2018-05-22 LAB — CBC
HCT: 38.8 % — ABNORMAL LOW (ref 39.0–52.0)
Hemoglobin: 12.9 g/dL — ABNORMAL LOW (ref 13.0–17.0)
MCH: 33.7 pg (ref 26.0–34.0)
MCHC: 33.2 g/dL (ref 30.0–36.0)
MCV: 101.3 fL — ABNORMAL HIGH (ref 80.0–100.0)
Platelets: 225 10*3/uL (ref 150–400)
RBC: 3.83 MIL/uL — ABNORMAL LOW (ref 4.22–5.81)
RDW: 13.7 % (ref 11.5–15.5)
WBC: 6.3 10*3/uL (ref 4.0–10.5)
nRBC: 0 % (ref 0.0–0.2)

## 2018-05-25 ENCOUNTER — Other Ambulatory Visit (HOSPITAL_COMMUNITY)
Admission: RE | Admit: 2018-05-25 | Discharge: 2018-05-25 | Disposition: A | Discharge status: Home / Self Care | Payer: Medicare Other | Source: Ambulatory Visit | Attending: Urology | Admitting: Urology

## 2018-05-25 DIAGNOSIS — Z01812 Encounter for preprocedural laboratory examination: Secondary | ICD-10-CM | POA: Diagnosis not present

## 2018-05-26 LAB — NOVEL CORONAVIRUS, NAA (HOSP ORDER, SEND-OUT TO REF LAB; TAT 18-24 HRS): SARS-CoV-2, NAA: NOT DETECTED

## 2018-05-27 NOTE — H&P (Signed)
Office Visit Report     05/14/2018   --------------------------------------------------------------------------------   Charles Mitchell  MRN: [id number]  PRIMARY CARE:  Jeannette How. Perini, MD  DOB: 1931/04/13, 85 year old Male  REFERRING:  Mark A. Joylene Draft, MD  SSN: -**[id number]  PROVIDER:  Festus Aloe, M.D.    LOCATION:  [facility] [phone]   --------------------------------------------------------------------------------   CC: I have symptoms of an enlarged prostate.  HPI: Charles Mitchell is a 85 year-old male established patient who is here for symptoms of enlarged prostate.  He first noticed the symptoms approximately 11/07/2005. His symptoms have not gotten worse over the last year. The patient has had a Laser Abalation.   He does not get up at night to urinate.   H/o frequency, urgency and urinary retentionin 2016. He failed a voiding trial. He underwent greenlight photo vaporization of the prostate. 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   He has a history of elevated PSA in the 5-6 range with a negative biopsy. His August 2017 PSA was 2.29. He was on Vesicare. PSA was 3.2 on 12/01/2017.   He has a slow stream. His PSA in 3.2. He has occasional urgency and UUI. He is on tolterodine now -- 4 mg. He empties but has frequency. AUASS = 19. PVR = 32. He had vertigo and stopped tamsulosin. He tried tolterodine and now is on Myrbetriq. He'd like to get off all meds. Cystoscopy showed fusion and obstruction of the mid-apical prostate on 02/02/2018 cystoscopy.   He returns for pre-op TURP. He's been well. No dysuria or gross hematuria. AUASS = 23. On Myrbetriq alone.     ALLERGIES: Sulfa Drugs    MEDICATIONS: Myrbetriq  Simvastatin 40 mg tablet  Flovent Diskus  Glucosamine & Chondroitin  Pantoprazole Sodium 40 mg tablet, delayed release  Sildenafil Citrate 100 mg tablet  Vitamin B-12  Vitamin D3     GU PSH: Cystoscopy - 02/02/2018 Laser Surgery Prostate - 2016       PSH Notes: Laser Vaporization With Transurethral Resection Of Prostate, Complete Colonoscopy, Colon Surgery   NON-GU PSH: Diagnostic Colonoscopy - 2016    GU PMH: BPH w/LUTS - 02/02/2018, - 12/09/2017, - 11/28/2015, Benign prostatic hyperplasia with urinary obstruction, - 2016 ED due to arterial insufficiency - 02/02/2018, - 12/09/2017, - 11/28/2015, Erectile dysfunction due to arterial insufficiency, - 2016 Urinary Frequency - 12/09/2017 Weak Urinary Stream - 12/09/2017 Urinary Urgency, Urinary urgency - 2016 Elevated PSA, PSA elevation - 2016 Urinary Retention, Acute urinary retention - 2016 Bladder-neck stenosis/contracture, Bladder outlet obstruction - 2016 BPH w/o LUTS, BPH (benign prostatic hyperplasia) - 2016 History of urolithiasis, History of renal calculi - 2016    NON-GU PMH: Bacteriuria, Bacteriuria, asymptomatic - 2016 Encounter for general adult medical examination without abnormal findings, Encounter for preventive health examination - 2016 Benign neoplasm of rectum, Rectal adenoma - 2016 Colon Cancer, History, History of malignant neoplasm of colon - 2016 Deficiency of other specified B group vitamins, Vitamin B 12 deficiency - 2016 Diverticulosis, Diverticulosis - 2016 Personal history of diseases of the skin and subcutaneous tissue, History of eczema - 2016 Personal history of other diseases of the digestive system, History of esophageal reflux - 2016 Personal history of other diseases of the respiratory system, History of asthma - 2016 Personal history of other endocrine, nutritional and metabolic disease, History of hyperlipidemia - 2016 Personal history of other infectious and parasitic diseases, History of pertussis - 2016, History of chicken pox, - 2016 Unspecified hearing  loss, unspecified ear, Decreased hearing - 2016    FAMILY HISTORY: No pertinent family history - Other   SOCIAL HISTORY: Marital Status: Single Preferred Language: English; Ethnicity: Not  Hispanic Or Latino; Race: White Current Smoking Status: Patient does not smoke anymore.  Does not use smokeless tobacco. Does not use drugs. Does not drink caffeine.     Notes: Former smoker, Alcohol use, Caffeine use, Widowed, Retired, Number of children, Former smoker   REVIEW OF SYSTEMS:    GU Review Male:   Patient reports frequent urination and get up at night to urinate. Patient denies hard to postpone urination, erection problems, penile pain, trouble starting your stream, have to strain to urinate , burning/ pain with urination, leakage of urine, and stream starts and stops.  Gastrointestinal (Upper):   Patient denies nausea, vomiting, and indigestion/ heartburn.  Gastrointestinal (Lower):   Patient denies diarrhea and constipation.  Constitutional:   Patient reports weight loss. Patient denies fever, night sweats, and fatigue.  Skin:   Patient denies skin rash/ lesion and itching.  Eyes:   Patient denies blurred vision and double vision.  Ears/ Nose/ Throat:   Patient denies sore throat and sinus problems.  Hematologic/Lymphatic:   Patient denies swollen glands and easy bruising.  Cardiovascular:   Patient denies leg swelling and chest pains.  Respiratory:   Patient denies cough and shortness of breath.  Endocrine:   Patient denies excessive thirst.  Musculoskeletal:   Patient denies back pain and joint pain.  Neurological:   Patient denies headaches and dizziness.  Psychologic:   Patient denies depression and anxiety.   VITAL SIGNS:      05/14/2018 08:47 AM  Weight 130 lb / 58.97 kg  Height 68 in / 172.72 cm  BP 139/72 mmHg  Pulse 76 /min  Temperature 97.7 F / 36.5 C  BMI 19.8 kg/m   MULTI-SYSTEM PHYSICAL EXAMINATION:    Constitutional: Well-nourished. No physical deformities. Normally developed. Good grooming.  Neck: Neck symmetrical, not swollen. Normal tracheal position.  Respiratory: No labored breathing, no use of accessory muscles.   Cardiovascular: Normal  temperature, normal extremity pulses, no swelling, no varicosities.  Skin: No paleness, no jaundice, no cyanosis. No lesion, no ulcer, no rash.  Neurologic / Psychiatric: Oriented to time, oriented to place, oriented to person. No depression, no anxiety, no agitation.  Gastrointestinal: No mass, no tenderness, no rigidity, non obese abdomen.     PAST DATA REVIEWED:  Source Of History:  Patient  Lab Test Review:   PSA  Records Review:   POC Tool   11/01/14 03/26/14 02/26/06  PSA  Total PSA 2.43  4.95  1.77   Free PSA  0.69    % Free PSA  14      PROCEDURES:          Urinalysis Dipstick Dipstick Cont'd  Color: Yellow Bilirubin: Neg mg/dL  Appearance: Clear Ketones: Neg mg/dL  Specific Gravity: 1.020 Blood: Neg ery/uL  pH: 6.0 Protein: Neg mg/dL  Glucose: Neg mg/dL Urobilinogen: 0.2 mg/dL    Nitrites: Neg    Leukocyte Esterase: Neg leu/uL    ASSESSMENT:      ICD-10 Details  1 GU:   BPH w/LUTS - N40.1   2   Urinary Frequency - R35.0    PLAN:            Medications Stop Meds: Tolterodine Tartrate Er 4 mg capsule, ext release 24 hr  Discontinue: 05/14/2018  - Reason: The medication cycle was completed.  Schedule Return Visit/Planned Activity: Next Available Appointment - Schedule Surgery          Document Letter(s):  Created for Patient: Clinical Summary         Notes:   BPH - I discussed with the patient the nature r/b/a to TURP including side effects of the procedure, expected post-op course and likelihood of success. He has foot on the floor urgency and sometimes urgency when he walks. We discussed flow symptoms and irritative symptoms typically improve, but frequency and urgency can persist and rarely worsen. We also discussed risk of bleeding, infection, stricture, sexual dysfunction and incontinence among others. All questions answered. He elects to proceed. We also discussed 5ari (he asked about preventing regrowth). We discussed we may need to add an  anticholinergic and consider PTNS as well.       * Signed by Festus Aloe, M.D. on 05/14/18 at 10:53 PM (EDT)*     The information contained in this medical record document is considered private and confidential patient information. This information can only be used for the medical diagnosis and/or medical services that are being provided by the patient's selected caregivers. This information can only be distributed outside of the patient's care if the patient agrees and signs waivers of authorization for this information to be sent to an outside source or route.

## 2018-05-27 NOTE — Progress Notes (Addendum)
Left patient a message to call back if he has any symptoms   SPOKE W/  _patient via phone call     [place] 19:   COUGH--no  RUNNY NOSE--- no  SORE THROAT---no  NASAL CONGESTION----yes. No change from baseline   SNEEZING----no  SHORTNESS OF BREATH---no  DIFFICULTY BREATHING---no  TEMP >100.0 -----no  UNEXPLAINED BODY ACHES------no  CHILLS -------- no  HEADACHES ---------no  LOSS OF SMELL/ TASTE --------no    HAVE YOU OR ANY FAMILY MEMBER TRAVELLED PAST 14 DAYS OUT OF THE   COUNTY---no STATE----no COUNTRY----no  HAVE YOU OR ANY FAMILY MEMBER BEEN EXPOSED TO ANYONE WITH COVID 19? no

## 2018-05-28 ENCOUNTER — Ambulatory Visit (HOSPITAL_COMMUNITY)
Admission: RE | Admit: 2018-05-28 | Discharge: 2018-05-28 | Disposition: A | Discharge status: Home / Self Care | Payer: Medicare Other | Attending: Urology | Admitting: Urology

## 2018-05-28 ENCOUNTER — Encounter (HOSPITAL_COMMUNITY): Payer: Self-pay

## 2018-05-28 ENCOUNTER — Encounter (HOSPITAL_COMMUNITY)
Admission: RE | Disposition: A | Discharge status: Home / Self Care | Payer: Self-pay | Source: Home / Self Care | Attending: Urology

## 2018-05-28 ENCOUNTER — Ambulatory Visit (HOSPITAL_COMMUNITY): Payer: Medicare Other | Admitting: Physician Assistant

## 2018-05-28 ENCOUNTER — Ambulatory Visit (HOSPITAL_COMMUNITY): Payer: Medicare Other | Admitting: Anesthesiology

## 2018-05-28 DIAGNOSIS — Z7951 Long term (current) use of inhaled steroids: Secondary | ICD-10-CM | POA: Insufficient documentation

## 2018-05-28 DIAGNOSIS — H9190 Unspecified hearing loss, unspecified ear: Secondary | ICD-10-CM | POA: Diagnosis not present

## 2018-05-28 DIAGNOSIS — E538 Deficiency of other specified B group vitamins: Secondary | ICD-10-CM | POA: Diagnosis not present

## 2018-05-28 DIAGNOSIS — Z79899 Other long term (current) drug therapy: Secondary | ICD-10-CM | POA: Diagnosis not present

## 2018-05-28 DIAGNOSIS — Z85038 Personal history of other malignant neoplasm of large intestine: Secondary | ICD-10-CM | POA: Insufficient documentation

## 2018-05-28 DIAGNOSIS — K219 Gastro-esophageal reflux disease without esophagitis: Secondary | ICD-10-CM | POA: Insufficient documentation

## 2018-05-28 DIAGNOSIS — N5201 Erectile dysfunction due to arterial insufficiency: Secondary | ICD-10-CM | POA: Diagnosis not present

## 2018-05-28 DIAGNOSIS — R350 Frequency of micturition: Secondary | ICD-10-CM | POA: Diagnosis not present

## 2018-05-28 DIAGNOSIS — E785 Hyperlipidemia, unspecified: Secondary | ICD-10-CM | POA: Diagnosis not present

## 2018-05-28 DIAGNOSIS — N401 Enlarged prostate with lower urinary tract symptoms: Secondary | ICD-10-CM | POA: Diagnosis not present

## 2018-05-28 DIAGNOSIS — J449 Chronic obstructive pulmonary disease, unspecified: Secondary | ICD-10-CM | POA: Insufficient documentation

## 2018-05-28 DIAGNOSIS — C61 Malignant neoplasm of prostate: Secondary | ICD-10-CM | POA: Diagnosis not present

## 2018-05-28 DIAGNOSIS — Z87891 Personal history of nicotine dependence: Secondary | ICD-10-CM | POA: Diagnosis not present

## 2018-05-28 HISTORY — PX: TRANSURETHRAL RESECTION OF PROSTATE: SHX73

## 2018-05-28 SURGERY — TURP (TRANSURETHRAL RESECTION OF PROSTATE)
Anesthesia: General

## 2018-05-28 MED ORDER — STERILE WATER FOR IRRIGATION IR SOLN
Status: DC | PRN
  Administered 2018-05-28: 500 mL

## 2018-05-28 MED ORDER — PROPOFOL 10 MG/ML IV BOLUS
INTRAVENOUS | Status: DC | PRN
  Administered 2018-05-28: 100 mg via INTRAVENOUS

## 2018-05-28 MED ORDER — HYDROMORPHONE HCL 1 MG/ML IJ SOLN: 0.2500 mg | INTRAMUSCULAR | Status: DC | PRN

## 2018-05-28 MED ORDER — FENTANYL CITRATE (PF) 100 MCG/2ML IJ SOLN
INTRAMUSCULAR | Status: AC
  Filled 2018-05-28: qty 2

## 2018-05-28 MED ORDER — 0.9 % SODIUM CHLORIDE (POUR BTL) OPTIME
TOPICAL | Status: DC | PRN
  Administered 2018-05-28: 09:00:00 1000 mL

## 2018-05-28 MED ORDER — DEXAMETHASONE SODIUM PHOSPHATE 10 MG/ML IJ SOLN
INTRAMUSCULAR | Status: DC | PRN
  Administered 2018-05-28: 5 mg via INTRAVENOUS

## 2018-05-28 MED ORDER — ACETAMINOPHEN 500 MG PO TABS
1000.0000 mg | ORAL_TABLET | Freq: Once | ORAL | Status: AC
  Administered 2018-05-28: 09:00:00 1000 mg via ORAL
  Filled 2018-05-28: qty 2

## 2018-05-28 MED ORDER — LIDOCAINE 2% (20 MG/ML) 5 ML SYRINGE
INTRAMUSCULAR | Status: DC | PRN
  Administered 2018-05-28: 60 mg via INTRAVENOUS

## 2018-05-28 MED ORDER — LIDOCAINE HCL URETHRAL/MUCOSAL 2 % EX GEL
CUTANEOUS | Status: DC | PRN
  Administered 2018-05-28: 1

## 2018-05-28 MED ORDER — CEFAZOLIN SODIUM-DEXTROSE 2-4 GM/100ML-% IV SOLN
2.0000 g | Freq: Once | INTRAVENOUS | Status: AC
  Administered 2018-05-28: 10:00:00 2 g via INTRAVENOUS
  Filled 2018-05-28: qty 100

## 2018-05-28 MED ORDER — FENTANYL CITRATE (PF) 100 MCG/2ML IJ SOLN
INTRAMUSCULAR | Status: DC | PRN
  Administered 2018-05-28 (×2): 50 ug via INTRAVENOUS

## 2018-05-28 MED ORDER — LACTATED RINGERS IV SOLN
INTRAVENOUS | Status: DC
  Administered 2018-05-28 (×2): via INTRAVENOUS

## 2018-05-28 MED ORDER — LIDOCAINE HCL URETHRAL/MUCOSAL 2 % EX GEL
CUTANEOUS | Status: AC
  Filled 2018-05-28: qty 5

## 2018-05-28 MED ORDER — ONDANSETRON HCL 4 MG/2ML IJ SOLN
INTRAMUSCULAR | Status: DC | PRN
  Administered 2018-05-28: 4 mg via INTRAVENOUS

## 2018-05-28 MED ORDER — SODIUM CHLORIDE 0.9 % IR SOLN
Status: DC | PRN
  Administered 2018-05-28: 15000 mL

## 2018-05-28 SURGICAL SUPPLY — 19 items
BAG URINE DRAINAGE (UROLOGICAL SUPPLIES) ×3 IMPLANT
BAG URO CATCHER STRL LF (MISCELLANEOUS) ×3 IMPLANT
CATH FOLEY 2WAY SLVR  5CC 20FR (CATHETERS) ×2
CATH FOLEY 2WAY SLVR 5CC 20FR (CATHETERS) IMPLANT
COVER WAND RF STERILE (DRAPES) IMPLANT
ELECT REM PT RETURN 15FT ADLT (MISCELLANEOUS) ×1 IMPLANT
EVACUATOR MICROVAS BLADDER (UROLOGICAL SUPPLIES) ×1 IMPLANT
GLOVE BIOGEL M STRL SZ7.5 (GLOVE) ×3 IMPLANT
GOWN STRL REUS W/TWL XL LVL3 (GOWN DISPOSABLE) ×3 IMPLANT
HOLDER FOLEY CATH W/STRAP (MISCELLANEOUS) ×2 IMPLANT
KIT TURNOVER KIT A (KITS) ×2 IMPLANT
LOOP CUT BIPOLAR 24F LRG (ELECTROSURGICAL) ×2 IMPLANT
MANIFOLD NEPTUNE II (INSTRUMENTS) ×3 IMPLANT
PACK CYSTO (CUSTOM PROCEDURE TRAY) ×3 IMPLANT
SYR 30ML LL (SYRINGE) IMPLANT
SYRINGE IRR TOOMEY STRL 70CC (SYRINGE) ×1 IMPLANT
TUBING CONNECTING 10 (TUBING) ×2 IMPLANT
TUBING CONNECTING 10' (TUBING) ×1
TUBING UROLOGY SET (TUBING) ×3 IMPLANT

## 2018-05-28 NOTE — Op Note (Signed)
Preoperative diagnosis: BPH with lower urinary tract symptoms Postoperative diagnosis: Same  Procedure: Transurethral resection of prostate  Surgeon: Junious Silk  Anesthesia: General  Indication for procedure: Charles Mitchell is an 85 year old male with a prior history of greenlight photo vaporization of the prostate.  He did well but has increasing lower urinary tract symptoms and on recent office cystoscopy there was apical regrowth and fusion of the lobes causing some visual obstruction.  More proximally the bladder neck was patent.  Findings: On cystoscopy the urethra was quite tight along the penile urethra and the continuous flow sheath and dilated it on the way in.  At the verumontanum the lateral lobes were fused especially anteriorly.  There was left greater than right lateral lobe hypertrophy.  At the bladder neck it was widely patent.  The bladder was moderately trabeculated.  The ureteral orifice ease appeared normal and there were normal orthotopic position.  There was good clear efflux.  No stone or foreign body in the bladder.  Main resection was of the lateral lobes and then an anterior lobe kept dropping down.  This was carefully resected to level let out with the prior proximal resection which was patent.  The bladder neck, 1 cm inside the bladder neck laterally and all of the posterior strip was patent and not touched.  I was very careful at the verumontanum resecting the apical and anterior tissue constantly referring back to the verumontanum as a landmark.  Resection was not carried distal to this.  On exam under anesthesia the penis was uncircumcised without mass or lesion.  On digital rectal exam prostate still quite enlarged at about 75 g.  It was smooth without hard area or nodule.  All landmarks preserved.  Description of procedure: After consent was obtained patient brought to the operating room.  After adequate anesthesia he was placed lithotomy position and prepped and draped in  usual sterile fashion.  A timeout was performed from the patient and procedure.  The cystoscope was passed per urethra and the prostatic urethra and the bladder carefully inspected.  The cystoscope was backed out and then I passed the continuous flow 26 French sheath with the visual obturator.  It was tight through the penile urethra but not the bulb.  I started at the left lateral prostatic tissue just inside the bladder neck and resected this down even with the prior resection anterior to posterior and then brought this down to the verumontanum and carefully resected.  I then carefully resected the right lateral lobe inside the bladder neck anterior to posterior proximal to distal and again careful at the apical region.  There was some residual anterior tissue which was resected.  Hemostasis was excellent.  There was no bleeding at low pressure.  All the chips were evacuated.  This re-created an excellent channel and although there was some residual anterior and lateral tissue it was widely patent and he should empty with a good flow.  Hopefully frequency and urgency will settle down but we discussed we may need to address that as well.  Inspection of the bladder again noted this to be normal.  There was no resection near the bladder neck or bladder.  Ureteral orifices again look normal.  The scope was removed.  Lidocaine jelly instilled per urethra and a 20 French catheter went in very smoothly and left to gravity drainage with 20 cc in the balloon.  Urine was clear.  He was awakened and taken to the cover room in stable condition.  Complications: None  Blood loss: Minimal  Specimens to pathology: TURP chips  Drains: 20 French Foley with 20 cc in the balloon  Disposition: Patient stable to PACU

## 2018-05-28 NOTE — Transfer of Care (Signed)
Immediate Anesthesia Transfer of Care Note  Patient: Charles Mitchell  Procedure(s) Performed: TRANSURETHRAL RESECTION OF THE PROSTATE (TURP) (N/A )  Patient Location: PACU  Anesthesia Type:General  Level of Consciousness: sedated, patient cooperative and responds to stimulation  Airway & Oxygen Therapy: Patient Spontanous Breathing and Patient connected to face mask oxygen  Post-op Assessment: Report given to RN and Post -op Vital signs reviewed and stable  Post vital signs: Reviewed and stable  Last Vitals:  Vitals Value Taken Time  BP 142/73 05/28/2018 10:48 AM  Temp    Pulse 70 05/28/2018 10:50 AM  Resp 14 05/28/2018 10:50 AM  SpO2 100 % 05/28/2018 10:50 AM  Vitals shown include unvalidated device data.  Last Pain:  Vitals:   05/28/18 0839  TempSrc:   PainSc: 0-No pain         Complications: No apparent anesthesia complications

## 2018-05-28 NOTE — Anesthesia Procedure Notes (Signed)
Procedure Name: LMA Insertion Performed by: Taleia Sadowski J, CRNA Pre-anesthesia Checklist: Patient identified, Emergency Drugs available, Suction available, Patient being monitored and Timeout performed Patient Re-evaluated:Patient Re-evaluated prior to induction Oxygen Delivery Method: Circle system utilized Preoxygenation: Pre-oxygenation with 100% oxygen Induction Type: IV induction Ventilation: Mask ventilation without difficulty LMA: LMA inserted LMA Size: 4.0 Number of attempts: 1 Placement Confirmation: positive ETCO2,  CO2 detector and breath sounds checked- equal and bilateral Tube secured with: Tape Dental Injury: Teeth and Oropharynx as per pre-operative assessment        

## 2018-05-28 NOTE — Anesthesia Postprocedure Evaluation (Signed)
Anesthesia Post Note  Patient: Charles Mitchell  Procedure(s) Performed: TRANSURETHRAL RESECTION OF THE PROSTATE (TURP) (N/A )     Patient location during evaluation: PACU Anesthesia Type: General Level of consciousness: awake and alert Pain management: pain level controlled Vital Signs Assessment: post-procedure vital signs reviewed and stable Respiratory status: spontaneous breathing, nonlabored ventilation and respiratory function stable Cardiovascular status: blood pressure returned to baseline and stable Postop Assessment: no apparent nausea or vomiting Anesthetic complications: no    Last Vitals:  Vitals:   05/28/18 1100 05/28/18 1115  BP: 120/74 129/71  Pulse: 77 75  Resp: 13 13  Temp:  (!) 36.4 C  SpO2: 99% 93%    Last Pain:  Vitals:   05/28/18 1115  TempSrc:   PainSc: 0-No pain                 Braeton Wolgamott,W. EDMOND

## 2018-05-28 NOTE — Interval H&P Note (Signed)
History and Physical Interval Note:  05/28/2018 9:38 AM  Charles Mitchell  has presented today for surgery, with the diagnosis of BENIGN PROSTATIC HYPERPLASIA.  The various methods of treatment have been discussed with the patient and family. After consideration of risks, benefits and other options for treatment, including side effects of the proposed treatment, the likelihood of the patient achieving the goals of the procedure, and any potential problems that might occur during the procedure or recuperation, the patient has consented to  Procedure(s): TRANSURETHRAL RESECTION OF THE PROSTATE (TURP) (N/A) as a surgical intervention.  The patient's history has been reviewed, patient examined, no change in status, stable for surgery.  I have reviewed the patient's chart and labs.  Questions were answered to the patient's satisfaction.    Festus Aloe

## 2018-05-28 NOTE — Anesthesia Preprocedure Evaluation (Addendum)
Anesthesia Evaluation  Patient identified by MRN, date of birth, ID band Patient awake    Reviewed: Allergy & Precautions, H&P , NPO status , Patient's Chart, lab work & pertinent test results  Airway Mallampati: II  TM Distance: >3 FB Neck ROM: Full    Dental no notable dental hx. (+) Teeth Intact, Dental Advisory Given   Pulmonary asthma , COPD,  COPD inhaler, former smoker,    Pulmonary exam normal breath sounds clear to auscultation       Cardiovascular negative cardio ROS   Rhythm:Regular Rate:Normal     Neuro/Psych negative neurological ROS  negative psych ROS   GI/Hepatic Neg liver ROS, GERD  Medicated and Controlled,  Endo/Other  negative endocrine ROS  Renal/GU negative Renal ROS  negative genitourinary   Musculoskeletal   Abdominal   Peds  Hematology negative hematology ROS (+)   Anesthesia Other Findings   Reproductive/Obstetrics negative OB ROS                            Anesthesia Physical Anesthesia Plan  ASA: II  Anesthesia Plan: General   Post-op Pain Management:    Induction: Intravenous  PONV Risk Score and Plan: 3 and Ondansetron, Dexamethasone and Treatment may vary due to age or medical condition  Airway Management Planned: LMA  Additional Equipment:   Intra-op Plan:   Post-operative Plan: Extubation in OR  Informed Consent: I have reviewed the patients History and Physical, chart, labs and discussed the procedure including the risks, benefits and alternatives for the proposed anesthesia with the patient or authorized representative who has indicated his/her understanding and acceptance.     Dental advisory given  Plan Discussed with: CRNA  Anesthesia Plan Comments:         Anesthesia Quick Evaluation

## 2018-05-28 NOTE — Discharge Instructions (Signed)
Indwelling Urinary Catheter Care, Adult An indwelling urinary catheter is a thin tube that is put into your bladder. The tube helps to drain pee (urine) out of your body. The tube goes in through your urethra. Your urethra is where pee comes out of your body. Your pee will come out through the catheter, then it will go into a bag (drainage bag). Take Ivette Castronova care of your catheter so it will work well. How to wear your catheter and bag Supplies needed  Sticky tape (adhesive tape) or a leg strap.  Alcohol wipe or soap and water (if you use tape).  A clean towel (if you use tape).  Large overnight bag.  Smaller bag (leg bag). Wearing your catheter Attach your catheter to your leg with tape or a leg strap.  Make sure the catheter is not pulled tight.  If a leg strap gets wet, take it off and put on a dry strap.  If you use tape to hold the bag on your leg: 1. Use an alcohol wipe or soap and water to wash your skin where the tape made it sticky before. 2. Use a clean towel to pat-dry that skin. 3. Use new tape to make the bag stay on your leg. Wearing your bags You should have been given a large overnight bag.  You may wear the overnight bag in the day or night.  Always have the overnight bag lower than your bladder.  Do not let the bag touch the floor.  Before you go to sleep, put a clean plastic bag in a wastebasket. Then hang the overnight bag inside the wastebasket. You should also have a smaller leg bag that fits under your clothes.  Always wear the leg bag below your knee.  Do not wear your leg bag at night. How to care for your skin and catheter Supplies needed  A clean washcloth.  Water and mild soap.  A clean towel. Caring for your skin and catheter      Clean the skin around your catheter every day: ? Wash your hands with soap and water. ? Wet a clean washcloth in warm water and mild soap. ? Clean the skin around your urethra. ? If you are male: ? Gently  spread the folds of skin around your vagina (labia). ? With the washcloth in your other hand, wipe the inner side of your labia on each side. Wipe from front to back. ? If you are male: ? Pull back any skin that covers the end of your penis (foreskin). ? With the washcloth in your other hand, wipe your penis in small circles. Start wiping at the tip of your penis, then move away from the catheter. ? With your free hand, hold the catheter close to where it goes into your body. ? Keep holding the catheter during cleaning so it does not get pulled out. ? With the washcloth in your other hand, clean the catheter. ? Only wipe downward on the catheter. ? Do not wipe upward toward your body. Doing this may push germs into your urethra and cause infection. ? Use a clean towel to pat-dry the catheter and the skin around it. Make sure to wipe off all soap. ? Wash your hands with soap and water.  Shower every day. Do not take baths.  Do not use cream, ointment, or lotion on the area where the catheter goes into your body, unless your doctor tells you to.  Do not use powders, sprays, or lotions  on your genital area.  Check your skin around the catheter every day for signs of infection. Check for: ? Redness, swelling, or pain. ? Fluid or blood. ? Warmth. ? Pus or a bad smell. How to empty the bag Supplies needed  Rubbing alcohol.  Gauze pad or cotton ball.  Tape or a leg strap. Emptying the bag Pour the pee out of your bag when it is ?- full, or at least 2-3 times a day. Do this for your overnight bag and your leg bag. 1. Wash your hands with soap and water. 2. Separate (detach) the bag from your leg. 3. Hold the bag over the toilet or a clean pail. Keep the bag lower than your hips and bladder. This is so the pee (urine) does not go back into the tube. 4. Open the pour spout. It is at the bottom of the bag. 5. Empty the pee into the toilet or pail. Do not let the pour spout touch any  surface. 6. Put rubbing alcohol on a gauze pad or cotton ball. 7. Use the gauze pad or cotton ball to clean the pour spout. 8. Close the pour spout. 9. Attach the bag to your leg with tape or a leg strap. 10. Wash your hands with soap and water. Follow instructions for cleaning the drainage bag:  From the product maker.  As told by your doctor. How to change the bag Supplies needed  Alcohol wipes.  A clean bag.  Tape or a leg strap. Changing the bag Replace your bag with a clean bag once a month. If it starts to leak, smell bad, or look dirty, change it sooner. 1. Wash your hands with soap and water. 2. Separate the dirty bag from your leg. 3. Pinch the catheter with your fingers so that pee does not spill out. 4. Separate the catheter tube from the bag tube where these tubes connect (at the connection valve). Do not let the tubes touch any surface. 5. Clean the end of the catheter tube with an alcohol wipe. Use a different alcohol wipe to clean the end of the bag tube. 6. Connect the catheter tube to the tube of the clean bag. 7. Attach the clean bag to your leg with tape or a leg strap. Do not make the bag tight on your leg. 8. Wash your hands with soap and water. General rules   Never pull on your catheter. Never try to take it out. Doing that can hurt you.  Always wash your hands before and after you touch your catheter or bag. Use a mild, fragrance-free soap. If you do not have soap and water, use hand sanitizer.  Always make sure there are no twists or bends (kinks) in the catheter tube.  Always make sure there are no leaks in the catheter or bag.  Drink enough fluid to keep your pee pale yellow.  Do not take baths, swim, or use a hot tub.  If you are male, wipe from front to back after you poop (have a bowel movement). Contact a doctor if:  Your pee is cloudy.  Your pee smells worse than usual.  Your catheter gets clogged.  Your catheter leaks.  Your  bladder feels full. Get help right away if:  You have redness, swelling, or pain where the catheter goes into your body.  You have fluid, blood, pus, or a bad smell coming from the area where the catheter goes into your body.  Your skin feels warm  where the catheter goes into your body.  You have a fever.  You have pain in your: ? Belly (abdomen). ? Legs. ? Lower back. ? Bladder.  You see blood in the catheter.  Your pee is pink or red.  You feel sick to your stomach (nauseous).  You throw up (vomit).  You have chills.  Your pee is not draining into the bag.  Your catheter gets pulled out. Summary  An indwelling urinary catheter is a thin tube that is placed into the bladder to help drain pee (urine) out of the body.  The catheter is placed into the part of the body that drains pee from the bladder (urethra).  Taking Charles Mitchell care of your catheter will keep it working properly and help prevent problems.  Always wash your hands before and after touching your catheter or bag.  Never pull on your catheter or try to take it out. This information is not intended to replace advice given to you by your health care provider. Make sure you discuss any questions you have with your health care provider. Document Released: 04/20/2012 Document Revised: 06/16/2017 Document Reviewed: 08/09/2016 Elsevier Interactive Patient Education  2019 [employer]   Transurethral Resection of the Prostate, Care After Refer to this sheet in the next few weeks. These instructions provide you with information about caring for yourself after your procedure. Your health care provider may also give you more specific instructions. Your treatment has been planned according to current medical practices, but problems sometimes occur. Call your health care provider if you have any problems or questions after your procedure. What can I expect after the procedure?  After the procedure, it is common to  have:  Mild pain in your lower abdomen.  Soreness or mild discomfort in your penis from having the catheter inserted during the procedure.  A feeling of urgency when you need to urinate.  A small amount of blood in your urine. You may notice some small blood clots in your urine. These are normal. Follow these instructions at home: Medicines   Take over-the-counter and prescription medicines only as told by your health care provider.  Do not drive or operate heavy machinery while taking prescription pain medicine.  Do not drive for 24 hours if you received a sedative.  If you were prescribed antibiotic medicine, take it as told by your health care provider. Do not stop taking the antibiotic even if you start to feel better. Activity  Return to your normal activities as told by your health care provider. Ask your health care provider what activities are safe for you.  Do not lift anything that is heavier than 10 lb (4.5 kg) for 3 weeks after your procedure, or as long as told by your health care provider.  Avoid intense physical activity for as long as told by your health care provider.  Avoid sitting for a long time without moving. Get up and move around one or more times every few hours. This helps to prevent blood clots. You may increase your physical activity gradually as you start to feel better. Lifestyle  Do not drink alcohol for as long as told by your health care provider. This is especially important if you are taking prescription pain medicines.  Do not engage in sexual activity until your health care provider says that you can do this. General instructions  Do not take baths, swim, or use a hot tub until your health care provider approves.  Drink enough fluid to  keep your urine clear or pale yellow.  Urinate as soon as you feel the need to. Do not try to hold your urine for long periods of time.  If your health care provider approves, you may take a stool softener  for 2-3 weeks to prevent you from straining to have a bowel movement.  Wear compression stockings as told by your health care provider. These stockings help to prevent blood clots and reduce swelling in your legs.  Keep all follow-up visits as told by your health care provider. This is important. Contact a health care provider if:  You have difficulty urinating.  You have a fever.  You have pain that gets worse or does not improve with medicine.  You have blood in your urine that does not go away after 1 week of resting and drinking more fluids.  You have swelling in your penis or testicles. Get help right away if:  You are unable to urinate.  You are having more blood clots in your urine instead of fewer.  You have: ? Large blood clots. ? A lot of blood in your urine. ? Pain in your back or lower abdomen. ? Pain or swelling in your legs. ? Chills and you are shaking. This information is not intended to replace advice given to you by your health care provider. Make sure you discuss any questions you have with your health care provider. Document Released: 12/24/2004 Document Revised: 02/07/2016 Document Reviewed: 09/15/2014 Elsevier Interactive Patient Education  2019 [employer] Anesthesia, Adult, Care After This sheet gives you information about how to care for yourself after your procedure. Your health care provider may also give you more specific instructions. If you have problems or questions, contact your health care provider. What can I expect after the procedure? After the procedure, the following side effects are common:  Pain or discomfort at the IV site.  Nausea.  Vomiting.  Sore throat.  Trouble concentrating.  Feeling cold or chills.  Weak or tired.  Sleepiness and fatigue.  Soreness and body aches. These side effects can affect parts of the body that were not involved in surgery. Follow these instructions at home:  For at least 24  hours after the procedure:  Have a responsible adult stay with you. It is important to have someone help care for you until you are awake and alert.  Rest as needed.  Do not: ? Participate in activities in which you could fall or become injured. ? Drive. ? Use heavy machinery. ? Drink alcohol. ? Take sleeping pills or medicines that cause drowsiness. ? Make important decisions or sign legal documents. ? Take care of children on your own. Eating and drinking  Follow any instructions from your health care provider about eating or drinking restrictions.  When you feel hungry, start by eating small amounts of foods that are soft and easy to digest (bland), such as toast. Gradually return to your regular diet.  Drink enough fluid to keep your urine pale yellow.  If you vomit, rehydrate by drinking water, juice, or clear broth. General instructions  If you have sleep apnea, surgery and certain medicines can increase your risk for breathing problems. Follow instructions from your health care provider about wearing your sleep device: ? Anytime you are sleeping, including during daytime naps. ? While taking prescription pain medicines, sleeping medicines, or medicines that make you drowsy.  Return to your normal activities as told by your health care provider. Ask your health  care provider what activities are safe for you.  Take over-the-counter and prescription medicines only as told by your health care provider.  If you smoke, do not smoke without supervision.  Keep all follow-up visits as told by your health care provider. This is important. Contact a health care provider if:  You have nausea or vomiting that does not get better with medicine.  You cannot eat or drink without vomiting.  You have pain that does not get better with medicine.  You are unable to pass urine.  You develop a skin rash.  You have a fever.  You have redness around your IV site that gets worse. Get  help right away if:  You have difficulty breathing.  You have chest pain.  You have blood in your urine or stool, or you vomit blood. Summary  After the procedure, it is common to have a sore throat or nausea. It is also common to feel tired.  Have a responsible adult stay with you for the first 24 hours after general anesthesia. It is important to have someone help care for you until you are awake and alert.  When you feel hungry, start by eating small amounts of foods that are soft and easy to digest (bland), such as toast. Gradually return to your regular diet.  Drink enough fluid to keep your urine pale yellow.  Return to your normal activities as told by your health care provider. Ask your health care provider what activities are safe for you. This information is not intended to replace advice given to you by your health care provider. Make sure you discuss any questions you have with your health care provider. Document Released: 04/01/2000 Document Revised: 08/09/2016 Document Reviewed: 08/09/2016 Elsevier Interactive Patient Education  2019 [employer].

## 2018-05-29 ENCOUNTER — Encounter (HOSPITAL_COMMUNITY): Payer: Self-pay | Admitting: Urology

## 2018-06-08 DIAGNOSIS — C61 Malignant neoplasm of prostate: Secondary | ICD-10-CM | POA: Diagnosis not present

## 2018-06-08 DIAGNOSIS — K219 Gastro-esophageal reflux disease without esophagitis: Secondary | ICD-10-CM | POA: Diagnosis not present

## 2018-06-08 DIAGNOSIS — M810 Age-related osteoporosis without current pathological fracture: Secondary | ICD-10-CM | POA: Diagnosis not present

## 2018-06-08 DIAGNOSIS — R413 Other amnesia: Secondary | ICD-10-CM | POA: Diagnosis not present

## 2018-06-08 DIAGNOSIS — R7301 Impaired fasting glucose: Secondary | ICD-10-CM | POA: Diagnosis not present

## 2018-06-08 DIAGNOSIS — J45909 Unspecified asthma, uncomplicated: Secondary | ICD-10-CM | POA: Diagnosis not present

## 2018-06-08 DIAGNOSIS — C20 Malignant neoplasm of rectum: Secondary | ICD-10-CM | POA: Diagnosis not present

## 2018-06-08 DIAGNOSIS — D510 Vitamin B12 deficiency anemia due to intrinsic factor deficiency: Secondary | ICD-10-CM | POA: Diagnosis not present

## 2018-06-18 ENCOUNTER — Other Ambulatory Visit (HOSPITAL_COMMUNITY): Payer: Self-pay | Admitting: *Deleted

## 2018-06-19 ENCOUNTER — Ambulatory Visit (HOSPITAL_COMMUNITY)
Admission: RE | Admit: 2018-06-19 | Discharge: 2018-06-19 | Disposition: A | Discharge status: Home / Self Care | Payer: Medicare Other | Source: Ambulatory Visit | Attending: Internal Medicine | Admitting: Internal Medicine

## 2018-06-19 ENCOUNTER — Other Ambulatory Visit: Payer: Self-pay

## 2018-06-19 DIAGNOSIS — M810 Age-related osteoporosis without current pathological fracture: Secondary | ICD-10-CM | POA: Insufficient documentation

## 2018-06-19 MED ORDER — DENOSUMAB 60 MG/ML ~~LOC~~ SOSY
60.0000 mg | PREFILLED_SYRINGE | Freq: Once | SUBCUTANEOUS | Status: AC
  Administered 2018-06-19: 60 mg via SUBCUTANEOUS

## 2018-06-19 MED ORDER — DENOSUMAB 60 MG/ML ~~LOC~~ SOSY
PREFILLED_SYRINGE | SUBCUTANEOUS | Status: AC
  Filled 2018-06-19: qty 1

## 2018-06-22 ENCOUNTER — Encounter: Payer: Self-pay | Admitting: Radiation Oncology

## 2018-06-22 ENCOUNTER — Telehealth: Payer: Self-pay | Admitting: Radiation Oncology

## 2018-06-22 NOTE — Telephone Encounter (Signed)
Contacted pt to verify telephone visit for pre reg °

## 2018-06-22 NOTE — Progress Notes (Signed)
GU Location of Tumor / Histology: prostatic adenocarcinoma  If Prostate Cancer, Gleason Score is (3 + 4) and PSA is (5.79). Prostate volume: 85 g.   12/01/2017  PSA  3.2 08/2015   PSA  2.29 11/01/2014  PSA  2.43 03/26/2014  PSA  4.95 02/26/2006  PSA  1.77      Past/Anticipated interventions by urology, if any: cystoscopy, TURP, referral to Dr. Tammi Klippel  Past/Anticipated interventions by medical oncology, if any: no  Weight changes, if any: no  Bowel/Bladder complaints, if any: Reports "a little bit of leakage" at night. Reports a burning sensation at the tip of his penis when his urine stream ends. Reports urinary urgency with standing, a good stream, and urinary frequency. Reports constipation. Reports symptoms are slowly improving over time. Reports taking Myrbetriq as prescribed.   Nausea/Vomiting, if any: no  Pain issues, if any:  no  SAFETY ISSUES:  Prior radiation? no  Pacemaker/ICD? no  Possible current pregnancy? no, male patient  Is the patient on methotrexate? no  Current Complaints / other details:  85 year old male. Former smoker. Widowed but has a significant other. AX: SULFA. Tamsulosin caused vertigo. Has history of colon ca with only surgical intervention.

## 2018-06-23 ENCOUNTER — Ambulatory Visit
Admission: RE | Admit: 2018-06-23 | Discharge: 2018-06-23 | Disposition: A | Discharge status: Home / Self Care | Payer: Medicare Other | Source: Ambulatory Visit | Attending: Radiation Oncology | Admitting: Radiation Oncology

## 2018-06-23 ENCOUNTER — Other Ambulatory Visit: Payer: Self-pay

## 2018-06-23 ENCOUNTER — Encounter: Payer: Self-pay | Admitting: Radiation Oncology

## 2018-06-23 DIAGNOSIS — C61 Malignant neoplasm of prostate: Secondary | ICD-10-CM

## 2018-06-23 HISTORY — DX: Unspecified malignant neoplasm of skin, unspecified: C44.90

## 2018-06-23 HISTORY — DX: Malignant neoplasm of colon, unspecified: C18.9

## 2018-06-23 HISTORY — DX: Malignant neoplasm of prostate: C61

## 2018-06-23 NOTE — Progress Notes (Signed)
Radiation Oncology         [phone] ________________________________  Initial Outpatient Consultation - Conducted via telephone due to current COVID-19 concerns for limiting patient exposure  Name: Charles Mitchell MRN: [id number]  Date: 06/23/2018  DOB: 03/05/1931  HF:[id number], Elta Guadeloupe, MD  Festus Aloe, MD   REFERRING PHYSICIAN: Festus Aloe, MD  DIAGNOSIS: 85 y.o. gentleman with Stage T1c adenocarcinoma of the prostate with Gleason score of 3+4, and PSA of 5.79.    ICD-10-CM   1. Malignant neoplasm of prostate ([place])  C61     HISTORY OF PRESENT ILLNESS: Charles Mitchell is an 85 y.o. male with a diagnosis of prostate cancer. He has a longstanding history of BPH with BOO and elevated PSA in the 5-6 range with a prior negative prostate biopsy in 1999 with Dr. Karsten Ro (PSA was 5.1 at time of biopsy). He is also s/p greenlight laser vaporization of the prostate in 2016 for management of his LUTS. Recently, he developed worsening LUTS and accordingly was referred for evaluation in urology by Dr. Junious Silk in 11/2017. On digital rectal exam, the prostate volume measured 75 cc and was smooth without nodules.  PSA was 3.2.  He was unable to tolerate Flomax and tried Myrbetriq for relief of LUTS. Due to persistent, significant LUTS despite medications, the patient elected to proceed with TURP on 05/28/2018.  Final pathology from his procedure revealed Gleason 3+4 adenocarcinoma of the prostate in 10% of the resected tissue.  A repeat PSA on 06/08/2018 was 5.79.  The patient reviewed the TURP results with his urologist and Active Surveillence was strongly encouraged given the small volume of disease and patient's advanced age.  However, the patient requested second opinion and has therefore kindly been referred today for discussion of potential radiation treatment options.  PSA History: 06/2018: PSA 5.79 11/2017: PSA 3.2 08/2015: PSA 2.29 10/2014: PSA 2.43 03/2014: PSA 4.95 02/2006: PSA 1.77  PREVIOUS RADIATION THERAPY: No  PAST MEDICAL HISTORY:  Past Medical History:  Diagnosis Date  . Asthma   . BPH (benign prostatic hypertrophy) with urinary retention   . Colon cancer [facility])    documentation refers to rectal ca but pt calls it colon ca  . COPD (chronic obstructive pulmonary disease) (HCC)    mgd by pcp  on flovent; per patient able to walk 2 miles per day with minimal dyspnea   . Diverticulosis of colon    severe  . Eczema   . Foley catheter in place    now removed   . GERD (gastroesophageal reflux disease)   . History of adenomatous polyp of colon   . History of melanoma excision    skin  . History of rectal cancer    01-24-2004  s/p  excision rectal cancer--  no recurrence  . Hyperlipidemia   . Prostate cancer ([place])   . Skin cancer   . Wears hearing aid in both ears       PAST SURGICAL HISTORY: Past Surgical History:  Procedure Laterality Date  . COLONOSCOPY  last one 07-06-2009  . GREEN LIGHT LASER TURP (TRANSURETHRAL RESECTION OF PROSTATE N/A 04/19/2014   Procedure: GREEN LIGHT LASER TURP (TRANSURETHRAL RESECTION OF PROSTATE;  Surgeon: Festus Aloe, MD;  Location: [facility];  Service: Urology;  Laterality: N/A;  . TRANSANAL EXCISION RECTAL CANCER/  HEMORRHOIDECTOMY  01-24-2004  . TRANSURETHRAL RESECTION OF PROSTATE N/A 05/28/2018   Procedure: TRANSURETHRAL RESECTION OF THE PROSTATE (TURP);  Surgeon: Festus Aloe, MD;  Location: [facility];  Service: Urology;  Laterality: N/A;    FAMILY HISTORY:  Family History  Problem Relation Age of Onset  . Cirrhosis Father   . Liver disease Father   . Colon cancer Brother   . Breast cancer Neg Hx   . Pancreatic cancer Neg Hx   . Prostate cancer Neg Hx     SOCIAL HISTORY:  Social History   Socioeconomic History  . Marital status: Significant Other    Spouse name: Not on file  . Number of children: 2  . Years of education: Not on file  . Highest education level: Not on file   Occupational History  . Not on file  Social Needs  . Financial resource strain: Not on file  . Food insecurity    Worry: Not on file    Inability: Not on file  . Transportation needs    Medical: Not on file    Non-medical: Not on file  Tobacco Use  . Smoking status: Former Smoker    Packs/day: 1.00    Years: 30.00    Pack years: 30.00    Types: Cigarettes    Quit date: 04/14/1983    Years since quitting: 35.2  . Smokeless tobacco: Never Used  Substance and Sexual Activity  . Alcohol use: Yes    Alcohol/week: 0.0 standard drinks    Comment: Wine every evening  . Drug use: No  . Sexual activity: Not Currently  Lifestyle  . Physical activity    Days per week: Not on file    Minutes per session: Not on file  . Stress: Not on file  Relationships  . Social Herbalist on phone: Not on file    Gets together: Not on file    Attends religious service: Not on file    Active member of club or organization: Not on file    Attends meetings of clubs or organizations: Not on file    Relationship status: Not on file  . Intimate partner violence    Fear of current or ex partner: Not on file    Emotionally abused: Not on file    Physically abused: Not on file    Forced sexual activity: Not on file  Other Topics Concern  . Not on file  Social History Narrative   Widowed but has a significant other. 2 children: one son, one daughter. Former smoker.    ALLERGIES: Sulfa antibiotics  MEDICATIONS:  Current Outpatient Medications  Medication Sig Dispense Refill  . denosumab (PROLIA) 60 MG/ML SOSY injection Inject 60 mg into the skin every 6 (six) months.    Marland Kitchen FLOVENT HFA 220 MCG/ACT inhaler Inhale 1 puff into the lungs 2 (two) times daily.     Marland Kitchen glucosamine-chondroitin 500-400 MG tablet Take by mouth.    Marland Kitchen MYRBETRIQ 50 MG TB24 tablet Take 50 mg by mouth every evening.    . pantoprazole (PROTONIX) 40 MG tablet Take 40 mg by mouth daily as needed (for acid reflux/indigestion.).      Marland Kitchen simvastatin (ZOCOR) 40 MG tablet Take 40 mg by mouth at bedtime.    . vitamin B-12 (CYANOCOBALAMIN) 1000 MCG tablet Take by mouth.    . Vitamin D, Cholecalciferol, 10 MCG (400 UNIT) TABS Take by mouth.    . sildenafil (VIAGRA) 100 MG tablet Take 100 mg by mouth daily as needed for erectile dysfunction.     No current facility-administered medications for this encounter.     REVIEW OF SYSTEMS:  On review of  systems, the patient reports that he is doing well overall. He denies any chest pain, shortness of breath, cough, fevers, chills, night sweats, or unintended weight changes. He denies abdominal pain, nausea or vomiting. He reports constipation. He denies any new musculoskeletal or joint aches or pains. His IPSS is 19 with increased urinary frequency and urgency with standing. He reports a little bit of leakage at night as well as a burning sensation at the tip of his penis when his urine stream ends. He has continued taking Myrbetriq as prescribed and states that his urinary symptoms are slowly improving over time. A complete review of systems is obtained and is otherwise negative.  PHYSICAL EXAM: Not performed in light of telephone consultation.  Wt Readings from Last 3 Encounters:  06/23/18 132 lb (59.9 kg)  05/28/18 132 lb 4 oz (60 kg)  05/22/18 132 lb 4 oz (60 kg)   Temp Readings from Last 3 Encounters:  06/19/18 (!) 97.3 F (36.3 C) (Temporal)  05/28/18 (!) 97.5 F (36.4 C)  05/22/18 97.9 F (36.6 C) (Oral)   BP Readings from Last 3 Encounters:  06/19/18 127/60  05/28/18 129/71  05/22/18 (!) 143/65   Pulse Readings from Last 3 Encounters:  06/19/18 84  05/28/18 75  05/22/18 69   Pain Assessment Pain Score: 0-No pain/10   LABORATORY DATA:  Lab Results  Component Value Date   WBC 6.3 05/22/2018   HGB 12.9 (L) 05/22/2018   HCT 38.8 (L) 05/22/2018   MCV 101.3 (H) 05/22/2018   PLT 225 05/22/2018   Lab Results  Component Value Date   NA 138 03/08/2014   K  3.7 03/08/2014   CL 105 03/08/2014   CO2 27 03/08/2014   Lab Results  Component Value Date   ALT 22 03/08/2014   AST 29 03/08/2014   ALKPHOS 41 03/08/2014   BILITOT 0.6 03/08/2014     RADIOGRAPHY: No results found.    IMPRESSION/PLAN: 1. 85 y.o. gentleman with Stage T1c adenocarcinoma of the prostate with Gleason Score of 3+4, and PSA of 5.79. We discussed the patient's workup and outlined the nature of prostate cancer in this setting. The patient's T stage, Gleason's score, and PSA put him into the favorable-intermediate risk (FIR) group. Accordingly, he is eligible for a variety of potential treatment options including active surveillance or 5.5 weeks of external radiation. The patient also had many questions regarding the option of stereotactic body radiotherapy (SBRT) to the prostate, which we discussed in detail and informed him that while we do not currently offer this here at our facility, it could be performed at [facility] or [facility] if he is interested. We discussed the available radiation techniques, and focused on the details and logistics of delivery as it relates to a 5.5 week course of prostate IMRT. We discussed and outlined the risks, benefits, short and long-term effects associated with radiotherapy. He was encouraged to ask questions that were answered to his stated satisfaction.  At the end of the conversation the patient is interested in continuing with close monitoring of his PSA in active surveillance.  We will share our discussion with Dr. Junious Silk and will look forward to following along in the patient's progress.  Of course, we would be more than happy to offer radiotherapy in the future if the patient elects to proceed with treatment.  Given current concerns for patient exposure during the COVID-19 pandemic, this encounter was conducted via telephone. The patient was notified in advance and was offered a [place]  meeting to allow for face to face communication but unfortunately  reported that he did not have the appropriate resources/technology to support such a visit and instead preferred to proceed with telephone consult. The patient has given verbal consent for this type of encounter. The time spent during this encounter was 60 minutes. The attendants for this meeting include Tyler Pita MD, Ashlyn Bruning PA-C, [place], and patient, Charles Mitchell. During the encounter, Tyler Pita MD, Ashlyn Bruning PA-C, and scribe, Rae Lips were located at [facility] Radiation Oncology Department.  Patient, Charles Mitchell was located at home.    Nicholos Johns, PA-C    Tyler Pita, MD  [facility] Direct Dial: [phone]  Fax: [phone] [email or web address]  Skype  LinkedIn  This document serves as a record of services personally performed by Tyler Pita, MD and Freeman Caldron, PA-C. It was created on their behalf by Rae Lips, a trained medical scribe. The creation of this record is based on the scribe's personal observations and the providers' statements to them. This document has been checked and approved by the attending providers.

## 2018-06-23 NOTE — Progress Notes (Signed)
See progress note under physician encounter. 

## 2018-06-24 DIAGNOSIS — C61 Malignant neoplasm of prostate: Secondary | ICD-10-CM | POA: Insufficient documentation

## 2018-07-03 ENCOUNTER — Encounter: Payer: Self-pay | Admitting: Medical Oncology

## 2018-07-03 NOTE — Progress Notes (Signed)
Called patient to introduce myself as the prostate nurse navigator and discuss my role. I was unable to meet him 6/16 when he consulted with Dr. Tammi Klippel.  He has decided to continue active surveillance at this time. He is scheduled to follow up with Dr. Junious Silk with PSA 09/18/18. I asked him to call me if I can be of assistance or answer any questions or concerns.

## 2019-10-07 ENCOUNTER — Inpatient Hospital Stay (HOSPITAL_COMMUNITY): Admission: RE | Admit: 2019-10-07 | Payer: Medicare Other | Source: Ambulatory Visit

## 2019-11-11 ENCOUNTER — Other Ambulatory Visit (HOSPITAL_COMMUNITY): Payer: Self-pay | Admitting: *Deleted

## 2019-11-15 ENCOUNTER — Other Ambulatory Visit: Payer: Self-pay

## 2019-11-15 ENCOUNTER — Ambulatory Visit (HOSPITAL_COMMUNITY)
Admission: RE | Admit: 2019-11-15 | Discharge: 2019-11-15 | Disposition: A | Discharge status: Home / Self Care | Payer: Medicare Other | Source: Ambulatory Visit | Attending: Internal Medicine | Admitting: Internal Medicine

## 2019-11-15 DIAGNOSIS — M810 Age-related osteoporosis without current pathological fracture: Secondary | ICD-10-CM | POA: Insufficient documentation

## 2019-11-15 MED ORDER — DENOSUMAB 60 MG/ML ~~LOC~~ SOSY
PREFILLED_SYRINGE | SUBCUTANEOUS | Status: AC
  Filled 2019-11-15: qty 1

## 2019-11-15 MED ORDER — DENOSUMAB 60 MG/ML ~~LOC~~ SOSY
60.0000 mg | PREFILLED_SYRINGE | Freq: Once | SUBCUTANEOUS | Status: AC
  Administered 2019-11-15: 60 mg via SUBCUTANEOUS

## 2020-01-14 DIAGNOSIS — R3915 Urgency of urination: Secondary | ICD-10-CM | POA: Diagnosis not present

## 2020-01-14 DIAGNOSIS — R350 Frequency of micturition: Secondary | ICD-10-CM | POA: Diagnosis not present

## 2020-01-27 DIAGNOSIS — M810 Age-related osteoporosis without current pathological fracture: Secondary | ICD-10-CM | POA: Diagnosis not present

## 2020-02-11 DIAGNOSIS — R350 Frequency of micturition: Secondary | ICD-10-CM | POA: Diagnosis not present

## 2020-02-11 DIAGNOSIS — R3915 Urgency of urination: Secondary | ICD-10-CM | POA: Diagnosis not present

## 2020-03-14 DIAGNOSIS — Z85820 Personal history of malignant melanoma of skin: Secondary | ICD-10-CM | POA: Diagnosis not present

## 2020-03-14 DIAGNOSIS — D171 Benign lipomatous neoplasm of skin and subcutaneous tissue of trunk: Secondary | ICD-10-CM | POA: Diagnosis not present

## 2020-03-14 DIAGNOSIS — Z85828 Personal history of other malignant neoplasm of skin: Secondary | ICD-10-CM | POA: Diagnosis not present

## 2020-03-14 DIAGNOSIS — L821 Other seborrheic keratosis: Secondary | ICD-10-CM | POA: Diagnosis not present

## 2020-03-17 DIAGNOSIS — R350 Frequency of micturition: Secondary | ICD-10-CM | POA: Diagnosis not present

## 2020-03-20 DIAGNOSIS — Z20822 Contact with and (suspected) exposure to covid-19: Secondary | ICD-10-CM | POA: Diagnosis not present

## 2020-03-20 DIAGNOSIS — Z03818 Encounter for observation for suspected exposure to other biological agents ruled out: Secondary | ICD-10-CM | POA: Diagnosis not present

## 2020-04-19 DIAGNOSIS — H52223 Regular astigmatism, bilateral: Secondary | ICD-10-CM | POA: Diagnosis not present

## 2020-04-27 DIAGNOSIS — C61 Malignant neoplasm of prostate: Secondary | ICD-10-CM | POA: Diagnosis not present

## 2020-05-02 DIAGNOSIS — R3915 Urgency of urination: Secondary | ICD-10-CM | POA: Diagnosis not present

## 2020-05-04 DIAGNOSIS — R350 Frequency of micturition: Secondary | ICD-10-CM | POA: Diagnosis not present

## 2020-05-04 DIAGNOSIS — C61 Malignant neoplasm of prostate: Secondary | ICD-10-CM | POA: Diagnosis not present

## 2020-05-04 DIAGNOSIS — N5201 Erectile dysfunction due to arterial insufficiency: Secondary | ICD-10-CM | POA: Diagnosis not present

## 2020-05-04 DIAGNOSIS — N401 Enlarged prostate with lower urinary tract symptoms: Secondary | ICD-10-CM | POA: Diagnosis not present

## 2020-05-30 DIAGNOSIS — R350 Frequency of micturition: Secondary | ICD-10-CM | POA: Diagnosis not present

## 2020-05-30 DIAGNOSIS — R3915 Urgency of urination: Secondary | ICD-10-CM | POA: Diagnosis not present

## 2020-06-23 DIAGNOSIS — C20 Malignant neoplasm of rectum: Secondary | ICD-10-CM | POA: Diagnosis not present

## 2020-06-23 DIAGNOSIS — C61 Malignant neoplasm of prostate: Secondary | ICD-10-CM | POA: Diagnosis not present

## 2020-06-23 DIAGNOSIS — E119 Type 2 diabetes mellitus without complications: Secondary | ICD-10-CM | POA: Diagnosis not present

## 2020-06-23 DIAGNOSIS — D510 Vitamin B12 deficiency anemia due to intrinsic factor deficiency: Secondary | ICD-10-CM | POA: Diagnosis not present

## 2020-06-27 DIAGNOSIS — R350 Frequency of micturition: Secondary | ICD-10-CM | POA: Diagnosis not present

## 2020-07-06 ENCOUNTER — Other Ambulatory Visit (HOSPITAL_COMMUNITY): Payer: Self-pay | Admitting: *Deleted

## 2020-07-07 ENCOUNTER — Ambulatory Visit (HOSPITAL_COMMUNITY)
Admission: RE | Admit: 2020-07-07 | Discharge: 2020-07-07 | Disposition: A | Discharge status: Home / Self Care | Payer: Medicare Other | Source: Ambulatory Visit | Attending: Internal Medicine | Admitting: Internal Medicine

## 2020-07-07 DIAGNOSIS — M810 Age-related osteoporosis without current pathological fracture: Secondary | ICD-10-CM | POA: Insufficient documentation

## 2020-07-07 MED ORDER — DENOSUMAB 60 MG/ML ~~LOC~~ SOSY
60.0000 mg | PREFILLED_SYRINGE | Freq: Once | SUBCUTANEOUS | Status: AC
  Administered 2020-07-07: 60 mg via SUBCUTANEOUS

## 2020-07-07 MED ORDER — DENOSUMAB 60 MG/ML ~~LOC~~ SOSY
PREFILLED_SYRINGE | SUBCUTANEOUS | Status: AC
  Filled 2020-07-07: qty 1

## 2020-07-25 DIAGNOSIS — R350 Frequency of micturition: Secondary | ICD-10-CM | POA: Diagnosis not present

## 2020-08-22 DIAGNOSIS — R350 Frequency of micturition: Secondary | ICD-10-CM | POA: Diagnosis not present

## 2020-09-21 DIAGNOSIS — R350 Frequency of micturition: Secondary | ICD-10-CM | POA: Diagnosis not present

## 2020-10-14 DIAGNOSIS — Z23 Encounter for immunization: Secondary | ICD-10-CM | POA: Diagnosis not present

## 2020-10-19 DIAGNOSIS — R350 Frequency of micturition: Secondary | ICD-10-CM | POA: Diagnosis not present

## 2020-10-26 DIAGNOSIS — C61 Malignant neoplasm of prostate: Secondary | ICD-10-CM | POA: Diagnosis not present

## 2020-11-02 DIAGNOSIS — C61 Malignant neoplasm of prostate: Secondary | ICD-10-CM | POA: Diagnosis not present

## 2020-11-02 DIAGNOSIS — R350 Frequency of micturition: Secondary | ICD-10-CM | POA: Diagnosis not present

## 2020-11-02 DIAGNOSIS — N401 Enlarged prostate with lower urinary tract symptoms: Secondary | ICD-10-CM | POA: Diagnosis not present

## 2020-11-02 DIAGNOSIS — N5201 Erectile dysfunction due to arterial insufficiency: Secondary | ICD-10-CM | POA: Diagnosis not present

## 2020-11-16 DIAGNOSIS — R350 Frequency of micturition: Secondary | ICD-10-CM | POA: Diagnosis not present

## 2020-11-22 DIAGNOSIS — J45909 Unspecified asthma, uncomplicated: Secondary | ICD-10-CM | POA: Diagnosis not present

## 2020-11-22 DIAGNOSIS — J069 Acute upper respiratory infection, unspecified: Secondary | ICD-10-CM | POA: Diagnosis not present

## 2020-11-22 DIAGNOSIS — R438 Other disturbances of smell and taste: Secondary | ICD-10-CM | POA: Diagnosis not present

## 2020-11-22 DIAGNOSIS — R0981 Nasal congestion: Secondary | ICD-10-CM | POA: Diagnosis not present

## 2020-12-14 DIAGNOSIS — R350 Frequency of micturition: Secondary | ICD-10-CM | POA: Diagnosis not present

## 2020-12-18 DIAGNOSIS — Z85820 Personal history of malignant melanoma of skin: Secondary | ICD-10-CM | POA: Diagnosis not present

## 2020-12-18 DIAGNOSIS — D1801 Hemangioma of skin and subcutaneous tissue: Secondary | ICD-10-CM | POA: Diagnosis not present

## 2020-12-18 DIAGNOSIS — L821 Other seborrheic keratosis: Secondary | ICD-10-CM | POA: Diagnosis not present

## 2020-12-18 DIAGNOSIS — Z85828 Personal history of other malignant neoplasm of skin: Secondary | ICD-10-CM | POA: Diagnosis not present

## 2020-12-21 DIAGNOSIS — M810 Age-related osteoporosis without current pathological fracture: Secondary | ICD-10-CM | POA: Diagnosis not present

## 2021-01-11 DIAGNOSIS — R350 Frequency of micturition: Secondary | ICD-10-CM | POA: Diagnosis not present

## 2021-01-11 DIAGNOSIS — R3915 Urgency of urination: Secondary | ICD-10-CM | POA: Diagnosis not present

## 2021-01-17 ENCOUNTER — Encounter (HOSPITAL_COMMUNITY): Payer: Medicare Other

## 2021-01-25 ENCOUNTER — Other Ambulatory Visit (HOSPITAL_COMMUNITY): Payer: Self-pay

## 2021-01-26 ENCOUNTER — Other Ambulatory Visit: Payer: Self-pay

## 2021-01-26 ENCOUNTER — Ambulatory Visit (HOSPITAL_COMMUNITY)
Admission: RE | Admit: 2021-01-26 | Discharge: 2021-01-26 | Disposition: A | Discharge status: Home / Self Care | Payer: Medicare Other | Source: Ambulatory Visit | Attending: Internal Medicine | Admitting: Internal Medicine

## 2021-01-26 DIAGNOSIS — M810 Age-related osteoporosis without current pathological fracture: Secondary | ICD-10-CM | POA: Diagnosis not present

## 2021-01-26 MED ORDER — DENOSUMAB 60 MG/ML ~~LOC~~ SOSY
60.0000 mg | PREFILLED_SYRINGE | Freq: Once | SUBCUTANEOUS | Status: AC
  Administered 2021-01-26: 60 mg via SUBCUTANEOUS

## 2021-01-26 MED ORDER — DENOSUMAB 60 MG/ML ~~LOC~~ SOSY
PREFILLED_SYRINGE | SUBCUTANEOUS | Status: AC
  Filled 2021-01-26: qty 1

## 2021-02-05 DIAGNOSIS — E785 Hyperlipidemia, unspecified: Secondary | ICD-10-CM | POA: Diagnosis not present

## 2021-02-05 DIAGNOSIS — M810 Age-related osteoporosis without current pathological fracture: Secondary | ICD-10-CM | POA: Diagnosis not present

## 2021-02-05 DIAGNOSIS — D510 Vitamin B12 deficiency anemia due to intrinsic factor deficiency: Secondary | ICD-10-CM | POA: Diagnosis not present

## 2021-02-05 DIAGNOSIS — R7301 Impaired fasting glucose: Secondary | ICD-10-CM | POA: Diagnosis not present

## 2021-02-05 DIAGNOSIS — E119 Type 2 diabetes mellitus without complications: Secondary | ICD-10-CM | POA: Diagnosis not present

## 2021-02-08 DIAGNOSIS — R350 Frequency of micturition: Secondary | ICD-10-CM | POA: Diagnosis not present

## 2021-02-09 ENCOUNTER — Encounter: Payer: Self-pay | Admitting: Pulmonary Disease

## 2021-02-09 ENCOUNTER — Other Ambulatory Visit: Payer: Self-pay

## 2021-02-09 ENCOUNTER — Ambulatory Visit: Payer: Medicare Other | Admitting: Pulmonary Disease

## 2021-02-09 VITALS — BP 116/74 | HR 70 | Ht 67.0 in | Wt 121.9 lb

## 2021-02-09 DIAGNOSIS — J449 Chronic obstructive pulmonary disease, unspecified: Secondary | ICD-10-CM | POA: Diagnosis not present

## 2021-02-09 DIAGNOSIS — R0981 Nasal congestion: Secondary | ICD-10-CM | POA: Diagnosis not present

## 2021-02-09 DIAGNOSIS — J310 Chronic rhinitis: Secondary | ICD-10-CM | POA: Diagnosis not present

## 2021-02-09 MED ORDER — IPRATROPIUM BROMIDE 0.03 % NA SOLN: 2.0000 | Freq: Two times a day (BID) | NASAL | 12 refills | Status: DC

## 2021-02-09 MED ORDER — CETIRIZINE HCL 10 MG PO TABS: 10.0000 mg | ORAL_TABLET | Freq: Every day | ORAL | 6 refills | Status: AC

## 2021-02-09 NOTE — Patient Instructions (Addendum)
Your COPD appears to be under control at this time. I believe your nose and sinuses are the main issue at this time.  Continue fluticasone nasal spray, 2 sprays per nostril daily  Start ipratropium nasal spray, 2 sprays per nostril twice dialy  Start zyrtec 10mg  daily for allergies  Follow up in 4-6 weeks with pulmonary function tests.

## 2021-02-09 NOTE — Progress Notes (Signed)
Synopsis: Referred in February 2023 for COPD by [name], MD  Subjective:   PATIENT ID: Charles Mitchell GENDER: male DOB: [birth year], MRN: [id number]  HPI  Chief Complaint  Patient presents with   Consult    Referred by PCP for COPD. States he was diagnosed around 10 years. Denies any current breathing concerns.    Charles Mitchell is an 88 year old male, former smoker with GERD who is referred to pulmonary clinic for COPD.   He denies issues with shortness of breath, cough or wheezing.  He denies any limitations in performing physical activity or working out in his yard due to his breathing.  He is using Flovent 220 MCG 2 puffs twice daily for his COPD with montelukast daily.  Main complaints include sinus congestion and anterior drainage.  He has trouble breathing out of his nose due to the congestion.  He is currently using fluticasone nasal spray daily. Wakes up each morning with pile of mucous from his nose.  He reports having no sense of smell or taste.  He was previously evaluated by ENT on 09/29/2018 for similar complaints after a viral infection.  He reports having a viral illness this past November in which a steroid Dosepak significantly improved his symptoms.  He is unsure if he has seasonal allergies.  He is a former smoker and quit 20 years ago.  He smoked 1.5 packs/day from age 86 to age 70.  He is retired and lives at home with his wife.  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  Past Medical History:  Diagnosis Date   Asthma    BPH (benign prostatic hypertrophy) with urinary retention    Colon cancer ([place])    documentation refers to rectal ca but pt calls it colon ca   COPD (chronic obstructive pulmonary disease) (HCC)    mgd by pcp  on flovent; per patient able to walk 2 miles per day with minimal dyspnea    Diverticulosis of colon    severe   Eczema    Foley catheter in place    now removed    GERD  (gastroesophageal reflux disease)    History of adenomatous polyp of colon    History of melanoma excision    skin   History of rectal cancer    01-24-2004  s/p  excision rectal cancer--  no recurrence   Hyperlipidemia    Prostate cancer ([place])    Skin cancer    Wears hearing aid in both ears      Family History  Problem Relation Age of Onset   Cirrhosis Father    Liver disease Father    Colon cancer Brother    Breast cancer Neg Hx    Pancreatic cancer Neg Hx    Prostate cancer Neg Hx      Social History   Socioeconomic History   Marital status: Significant Other    Spouse name: Not on file   Number of children: 2   Years of education: Not on file   Highest education level: Not on file  Occupational History   Not on file  Tobacco Use   Smoking status: Former    Packs/day: 1.00    Years: 30.00    Pack years: 30.00    Types: Cigarettes    Quit date: 04/14/1983    Years since quitting: 37.8   Smokeless tobacco: Never  Vaping Use   Vaping Use: Never used  Substance and Sexual Activity   Alcohol use: Yes    Alcohol/week: 0.0 standard drinks    Comment: Wine every evening   Drug use: No   Sexual activity: Not Currently  Other Topics Concern   Not on file  Social History Narrative   Widowed but has a significant other. 2 children: one son, one daughter. Former smoker.   Social Determinants of Health   Financial Resource Strain: Not on file  Food Insecurity: Not on file  Transportation Needs: Not on file  Physical Activity: Not on file  Stress: Not on file  Social Connections: Not on file  Intimate Partner Violence: Not on file     Allergies  Allergen Reactions   Sulfa Antibiotics Other (See Comments)    Unknown childhood reaction     Outpatient Medications Prior to Visit  Medication Sig Dispense Refill   Cholecalciferol (VITAMIN D3) 50 MCG (2000 UT) capsule 1 capsule     denosumab (PROLIA) 60 MG/ML SOSY injection Inject 60 mg into the skin every 6 (six)  months.     FLOVENT HFA 220 MCG/ACT inhaler Inhale 1 puff into the lungs 2 (two) times daily.      fluticasone (FLONASE) 50 MCG/ACT nasal spray Place 2 sprays into both nostrils daily.     glucosamine-chondroitin 500-400 MG tablet Take by mouth.     montelukast (SINGULAIR) 10 MG tablet Take 10 mg by mouth daily.     MYRBETRIQ 50 MG TB24 tablet Take 50 mg by mouth every evening.     pantoprazole (PROTONIX) 40 MG tablet Take 40 mg by mouth daily as needed (for acid reflux/indigestion.).      sildenafil (VIAGRA) 100 MG tablet Take 100 mg by mouth daily as needed for erectile dysfunction.     simvastatin (ZOCOR) 40 MG tablet Take 1 tablet by mouth daily.     vitamin B-12 (CYANOCOBALAMIN) 1000 MCG tablet Take by mouth.     simvastatin (ZOCOR) 40 MG tablet Take 40 mg by mouth at bedtime.     Vitamin D, Cholecalciferol, 10 MCG (400 UNIT) TABS Take by mouth.     No facility-administered medications prior to visit.   Review of Systems  Constitutional:  Negative for chills, fever, malaise/fatigue and weight loss.  HENT:  Positive for congestion. Negative for sinus pain and sore throat.   Eyes: Negative.   Respiratory:  Negative for cough, hemoptysis, sputum production, shortness of breath and wheezing.   Cardiovascular:  Negative for chest pain, palpitations, orthopnea, claudication and leg swelling.  Gastrointestinal:  Negative for abdominal pain, heartburn, nausea and vomiting.  Genitourinary: Negative.   Musculoskeletal:  Negative for joint pain and myalgias.  Skin:  Negative for rash.  Neurological:  Negative for weakness.  Endo/Heme/Allergies: Negative.   Psychiatric/Behavioral: Negative.     Objective:   Vitals:   02/09/21 1402  BP: 116/74  Pulse: 70  SpO2: 96%  Weight: 121 lb 14.4 oz (55.3 kg)  Height: 5\' 7"  (1.702 m)   Physical Exam Constitutional:      General: He is not in acute distress. HENT:     Head: Normocephalic and atraumatic.     Nose: Congestion and rhinorrhea  present.     Mouth/Throat:     Mouth: Mucous membranes are moist.     Pharynx: Oropharynx is clear.  Eyes:     Extraocular Movements: Extraocular movements intact.     Conjunctiva/sclera: Conjunctivae normal.     Pupils: Pupils are equal, round, and reactive to light.  Cardiovascular:     Rate and Rhythm: Normal rate and regular rhythm.     Pulses: Normal pulses.     Heart sounds: Normal heart sounds. No murmur heard. Pulmonary:     Breath sounds: Wheezing (mild, left base) present.  Abdominal:     General: Bowel sounds are normal.     Palpations: Abdomen is soft.  Musculoskeletal:     Right lower leg: No edema.     Left lower leg: No edema.  Lymphadenopathy:     Cervical: No cervical adenopathy.  Skin:    General: Skin is warm and dry.  Neurological:     General: No focal deficit present.     Mental Status: He is alert.  Psychiatric:        Mood and Affect: Mood normal.        Behavior: Behavior normal.        Thought Content: Thought content normal.        Judgment: Judgment normal.   CBC    Component Value Date/Time   WBC 6.3 05/22/2018 1118   RBC 3.83 (L) 05/22/2018 1118   HGB 12.9 (L) 05/22/2018 1118   HCT 38.8 (L) 05/22/2018 1118   PLT 225 05/22/2018 1118   MCV 101.3 (H) 05/22/2018 1118   MCH 33.7 05/22/2018 1118   MCHC 33.2 05/22/2018 1118   RDW 13.7 05/22/2018 1118   BMP Latest Ref Rng & Units 03/08/2014  Glucose 70 - 99 mg/dL 132(H)  BUN 6 - 23 mg/dL 13  Creatinine 0.50 - 1.35 mg/dL 1.02  Sodium 135 - 145 mmol/L 138  Potassium 3.5 - 5.1 mmol/L 3.7  Chloride 96 - 112 mmol/L 105  CO2 19 - 32 mmol/L 27  Calcium 8.4 - 10.5 mg/dL 8.4   Chest imaging:  CXR 03/08/14 Midline trachea. Normal heart size with a mildly tortuous thoracic aorta. No pleural effusion or pneumothorax. The Chin overlies the left lung apex. Mild left base scarring or atelectasis laterally.  PFT: No flowsheet data found.  Labs:  Path:  Echo:  Heart Catheterization:     Assessment & Plan:   Sinus congestion  Chronic obstructive pulmonary disease, unspecified COPD type ([place])  Chronic rhinitis  Discussion: Abdias Hickam is an 88 year old male, former smoker with GERD who is referred to pulmonary clinic for COPD.   Patient is to continue on Flovent 220 MCG 2 puffs twice daily for COPD.  We will obtain pulmonary function tests at follow-up in 4 to 6 weeks.  He is to continue fluticasone nasal spray for significant sinus congestion and rhinorrhea/rhinitis.  We will add ipratropium nasal spray 2 sprays per nostril twice daily.  He is to also start taking Zyrtec 10 mg daily for the rhinitis.  Follow-up in 4 to 6 weeks with PFTs.  Freda Jackson, MD [facility] Office: [phone]    Current Outpatient Medications:    Cholecalciferol (VITAMIN D3) 50 MCG (2000 UT) capsule, 1 capsule, Disp: , Rfl:    denosumab (PROLIA) 60 MG/ML SOSY injection, Inject 60 mg into the skin every 6 (six) months., Disp: , Rfl:    FLOVENT HFA 220 MCG/ACT inhaler, Inhale 1 puff into the lungs 2 (two) times daily. , Disp: , Rfl:    fluticasone (FLONASE) 50 MCG/ACT nasal spray, Place 2 sprays into both nostrils daily., Disp: , Rfl:    glucosamine-chondroitin 500-400 MG tablet, Take by mouth., Disp: , Rfl:    montelukast (SINGULAIR) 10 MG tablet, Take 10 mg by  mouth daily., Disp: , Rfl:    MYRBETRIQ 50 MG TB24 tablet, Take 50 mg by mouth every evening., Disp: , Rfl:    pantoprazole (PROTONIX) 40 MG tablet, Take 40 mg by mouth daily as needed (for acid reflux/indigestion.). , Disp: , Rfl:    sildenafil (VIAGRA) 100 MG tablet, Take 100 mg by mouth daily as needed for erectile dysfunction., Disp: , Rfl:    simvastatin (ZOCOR) 40 MG tablet, Take 1 tablet by mouth daily., Disp: , Rfl:    vitamin B-12 (CYANOCOBALAMIN) 1000 MCG tablet, Take by mouth., Disp: , Rfl:

## 2021-02-12 DIAGNOSIS — R82998 Other abnormal findings in urine: Secondary | ICD-10-CM | POA: Diagnosis not present

## 2021-03-08 DIAGNOSIS — R350 Frequency of micturition: Secondary | ICD-10-CM | POA: Diagnosis not present

## 2021-03-20 ENCOUNTER — Ambulatory Visit (INDEPENDENT_AMBULATORY_CARE_PROVIDER_SITE_OTHER): Payer: Medicare Other | Admitting: Pulmonary Disease

## 2021-03-20 ENCOUNTER — Ambulatory Visit: Payer: Medicare Other | Admitting: Pulmonary Disease

## 2021-03-20 ENCOUNTER — Encounter: Payer: Self-pay | Admitting: Pulmonary Disease

## 2021-03-20 ENCOUNTER — Other Ambulatory Visit: Payer: Self-pay

## 2021-03-20 VITALS — BP 116/72 | HR 68 | Ht 66.0 in | Wt 120.8 lb

## 2021-03-20 DIAGNOSIS — J449 Chronic obstructive pulmonary disease, unspecified: Secondary | ICD-10-CM | POA: Diagnosis not present

## 2021-03-20 DIAGNOSIS — J310 Chronic rhinitis: Secondary | ICD-10-CM | POA: Diagnosis not present

## 2021-03-20 DIAGNOSIS — R0981 Nasal congestion: Secondary | ICD-10-CM | POA: Diagnosis not present

## 2021-03-20 LAB — PULMONARY FUNCTION TEST
DL/VA % pred: 72 %
DL/VA: 2.81 ml/min/mmHg/L
DLCO cor % pred: 66 %
DLCO cor: 13.75 ml/min/mmHg
DLCO unc % pred: 66 %
DLCO unc: 13.75 ml/min/mmHg
FEF 25-75 Post: 1.01 L/sec
FEF 25-75 Pre: 0.74 L/sec
FEF2575-%Change-Post: 37 %
FEF2575-%Pred-Post: 79 %
FEF2575-%Pred-Pre: 58 %
FEV1-%Change-Post: 8 %
FEV1-%Pred-Post: 88 %
FEV1-%Pred-Pre: 82 %
FEV1-Post: 1.85 L
FEV1-Pre: 1.71 L
FEV1FVC-%Change-Post: 4 %
FEV1FVC-%Pred-Pre: 81 %
FEV6-%Change-Post: 6 %
FEV6-%Pred-Post: 108 %
FEV6-%Pred-Pre: 101 %
FEV6-Post: 3.03 L
FEV6-Pre: 2.84 L
FEV6FVC-%Change-Post: 3 %
FEV6FVC-%Pred-Post: 108 %
FEV6FVC-%Pred-Pre: 104 %
FVC-%Change-Post: 3 %
FVC-%Pred-Post: 100 %
FVC-%Pred-Pre: 97 %
FVC-Post: 3.07 L
FVC-Pre: 2.98 L
Post FEV1/FVC ratio: 60 %
Post FEV6/FVC ratio: 99 %
Pre FEV1/FVC ratio: 57 %
Pre FEV6/FVC Ratio: 95 %
RV % pred: 122 %
RV: 3.16 L
TLC % pred: 99 %
TLC: 6.27 L

## 2021-03-20 MED ORDER — ADVAIR HFA 230-21 MCG/ACT IN AERO: 2.0000 | INHALATION_SPRAY | Freq: Two times a day (BID) | RESPIRATORY_TRACT | 12 refills | Status: DC

## 2021-03-20 NOTE — Progress Notes (Signed)
PFT done today. 

## 2021-03-20 NOTE — Patient Instructions (Addendum)
Your breathing tests show you have COPD that appears mild.  ? ?Continue fluticasone nasal spray, 2 sprays per nostril daily ? ?Continue ipratropium nasal spray, 2 sprays per nostril twice daily.  ? ?Recommend staying on nasal sprays throughout Spring. You can taper off ipratropium nasal spray first and then fluticasone if your sinus congestion is better.  ? ?Continue montelukast and zyrtec for allergies.  ?

## 2021-03-20 NOTE — Progress Notes (Signed)
? ?Synopsis: Referred in February 2023 for COPD by [name], MD ? ?Subjective:  ? ?PATIENT ID: Charles Mitchell: male DOB: Jun 30, 1931, MRN: [id number] ? ?HPI ? ?Chief Complaint  ?Patient presents with  ? Follow-up  ?  F/U after PFT.  ? ?Charles Mitchell is an 88 year old male, former smoker with GERD who returns to pulmonary clinic for COPD.  ? ?Patient has been doing well since last visit.  He continues to take Flovent 220 mcg 2 puffs twice daily and montelukast daily. ? ?Pulmonary function test today show mild obstructive defect, no significant response to bronchodilators and mild diffusion defect. ? ?He reports improvement in rhinitis and postnasal drainage with fluticasone and ipratropium nasal sprays. ? ?OV 02/09/21 ?He denies issues with shortness of breath, cough or wheezing.  He denies any limitations in performing physical activity or working out in his yard due to his breathing.  He is using Flovent 220 MCG 2 puffs twice daily for his COPD with montelukast daily. ? ?Main complaints include sinus congestion and anterior drainage.  He has trouble breathing out of his nose due to the congestion.  He is currently using fluticasone nasal spray daily. ?Wakes up each morning with pile of mucous from his nose.  He reports having no sense of smell or taste.  He was previously evaluated by ENT on 09/29/2018 for similar complaints after a viral infection.  He reports having a viral illness this past November in which a steroid Dosepak significantly improved his symptoms. ? ?He is unsure if he has seasonal allergies. ? ?He is a former smoker and quit 20 years ago.  He smoked 1.5 packs/day from age 70 to age 56.  He is retired and lives at home with his wife.  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 ? ?Past Medical History:  ?Diagnosis Date  ? Asthma   ? BPH (benign prostatic hypertrophy) with urinary retention   ? Colon cancer ([place])   ? documentation refers to  rectal ca but pt calls it colon ca  ? COPD (chronic obstructive pulmonary disease) ([place])   ? mgd by pcp  on flovent; per patient able to walk 2 miles per day with minimal dyspnea   ? Diverticulosis of colon   ? severe  ? Eczema   ? Foley catheter in place   ? now removed   ? GERD (gastroesophageal reflux disease)   ? History of adenomatous polyp of colon   ? History of melanoma excision   ? skin  ? History of rectal cancer   ? 01-24-2004  s/p  excision rectal cancer--  no recurrence  ? Hyperlipidemia   ? Prostate cancer ([place])   ? Skin cancer   ? Wears hearing aid in both ears   ?  ? ?Family History  ?Problem Relation Age of Onset  ? Cirrhosis Father   ? Liver disease Father   ? Colon cancer Brother   ? Breast cancer Neg Hx   ? Pancreatic cancer Neg Hx   ? Prostate cancer Neg Hx   ?  ? ?Social History  ? ?Socioeconomic History  ? Marital status: Significant Other  ?  Spouse name: Not on file  ? Number of children: 2  ? Years of education: Not on file  ? Highest education level: Not on file  ?Occupational History  ? Not on file  ?Tobacco Use  ? Smoking status: Former  ?  Packs/day: 1.00  ?  Years: 30.00  ?  Pack years: 30.00  ?  Types: Cigarettes  ?  Quit date: 04/14/1983  ?  Years since quitting: 37.9  ? Smokeless tobacco: Never  ?Vaping Use  ? Vaping Use: Never used  ?Substance and Sexual Activity  ? Alcohol use: Yes  ?  Alcohol/week: 0.0 standard drinks  ?  Comment: Wine every evening  ? Drug use: No  ? Sexual activity: Not Currently  ?Other Topics Concern  ? Not on file  ?Social History Narrative  ? Widowed but has a significant other. 2 children: one son, one daughter. Former smoker.  ? ?Social Determinants of Health  ? ?Financial Resource Strain: Not on file  ?Food Insecurity: Not on file  ?Transportation Needs: Not on file  ?Physical Activity: Not on file  ?Stress: Not on file  ?Social Connections: Not on file  ?Intimate Partner Violence: Not on file  ?  ? ?Allergies  ?Allergen Reactions  ? Sulfa Antibiotics  Other (See Comments)  ?  Unknown childhood reaction  ?  ? ?Outpatient Medications Prior to Visit  ?Medication Sig Dispense Refill  ? cetirizine (ZYRTEC ALLERGY) 10 MG tablet Take 1 tablet (10 mg total) by mouth daily. 30 tablet 6  ? Cholecalciferol (VITAMIN D3) 50 MCG (2000 UT) capsule 1 capsule    ? denosumab (PROLIA) 60 MG/ML SOSY injection Inject 60 mg into the skin every 6 (six) months.    ? fluticasone (FLONASE) 50 MCG/ACT nasal spray Place 2 sprays into both nostrils daily.    ? glucosamine-chondroitin 500-400 MG tablet Take by mouth.    ? ipratropium (ATROVENT) 0.03 % nasal spray Place 2 sprays into both nostrils every 12 (twelve) hours. 30 mL 12  ? montelukast (SINGULAIR) 10 MG tablet Take 10 mg by mouth daily.    ? MYRBETRIQ 50 MG TB24 tablet Take 50 mg by mouth every evening.    ? pantoprazole (PROTONIX) 40 MG tablet Take 40 mg by mouth daily as needed (for acid reflux/indigestion.).     ? sildenafil (VIAGRA) 100 MG tablet Take 100 mg by mouth daily as needed for erectile dysfunction.    ? simvastatin (ZOCOR) 40 MG tablet Take 1 tablet by mouth daily.    ? vitamin B-12 (CYANOCOBALAMIN) 1000 MCG tablet Take by mouth.    ? FLOVENT HFA 220 MCG/ACT inhaler Inhale 1 puff into the lungs 2 (two) times daily.     ? ?No facility-administered medications prior to visit.  ? ?Review of Systems  ?Constitutional:  Negative for chills, fever, malaise/fatigue and weight loss.  ?HENT:  Positive for congestion. Negative for sinus pain and sore throat.   ?Eyes: Negative.   ?Respiratory:  Negative for cough, hemoptysis, sputum production, shortness of breath and wheezing.   ?Cardiovascular:  Negative for chest pain, palpitations, orthopnea, claudication and leg swelling.  ?Gastrointestinal:  Negative for abdominal pain, heartburn, nausea and vomiting.  ?Genitourinary: Negative.   ?Musculoskeletal:  Negative for joint pain and myalgias.  ?Skin:  Negative for rash.  ?Neurological:  Negative for weakness.  ?Endo/Heme/Allergies:  Negative.   ?Psychiatric/Behavioral: Negative.    ? ?Objective:  ? ?Vitals:  ? 03/20/21 1342  ?BP: 116/72  ?Pulse: 68  ?SpO2: 96%  ?Weight: 120 lb 12.8 oz (54.8 kg)  ?Height: '5\' 6"'$  (1.676 m)  ? ?Physical Exam ?Constitutional:   ?   General: He is not in acute distress. ?HENT:  ?   Head: Normocephalic and atraumatic.  ?   Nose: No congestion or rhinorrhea.  ?  Mouth/Throat:  ?   Mouth: Mucous membranes are moist.  ?   Pharynx: Oropharynx is clear.  ?Eyes:  ?   Conjunctiva/sclera: Conjunctivae normal.  ?Cardiovascular:  ?   Rate and Rhythm: Normal rate and regular rhythm.  ?   Pulses: Normal pulses.  ?   Heart sounds: Normal heart sounds. No murmur heard. ?Pulmonary:  ?   Effort: Pulmonary effort is normal.  ?   Breath sounds: No wheezing, rhonchi or rales.  ?Musculoskeletal:  ?   Right lower leg: No edema.  ?   Left lower leg: No edema.  ?Skin: ?   General: Skin is warm and dry.  ?Neurological:  ?   General: No focal deficit present.  ?   Mental Status: He is alert.  ?Psychiatric:     ?   Mood and Affect: Mood normal.     ?   Behavior: Behavior normal.     ?   Thought Content: Thought content normal.     ?   Judgment: Judgment normal.  ? ?CBC ?   ?Component Value Date/Time  ? WBC 6.3 05/22/2018 1118  ? RBC 3.83 (L) 05/22/2018 1118  ? HGB 12.9 (L) 05/22/2018 1118  ? HCT 38.8 (L) 05/22/2018 1118  ? PLT 225 05/22/2018 1118  ? MCV 101.3 (H) 05/22/2018 1118  ? MCH 33.7 05/22/2018 1118  ? MCHC 33.2 05/22/2018 1118  ? RDW 13.7 05/22/2018 1118  ? ?BMP Latest Ref Rng & Units 03/08/2014  ?Glucose 70 - 99 mg/dL 132(H)  ?BUN 6 - 23 mg/dL 13  ?Creatinine 0.50 - 1.35 mg/dL 1.02  ?Sodium 135 - 145 mmol/L 138  ?Potassium 3.5 - 5.1 mmol/L 3.7  ?Chloride 96 - 112 mmol/L 105  ?CO2 19 - 32 mmol/L 27  ?Calcium 8.4 - 10.5 mg/dL 8.4  ? ?Chest imaging: ? CXR 03/08/14 ?Midline trachea. Normal heart size with a mildly tortuous thoracic ?aorta. No pleural effusion or pneumothorax. The Chin overlies the ?left lung apex. Mild left base scarring or  atelectasis laterally. ? ?PFT: ?PFT Results Latest Ref Rng & Units 03/20/2021  ?FVC-Pre L 2.98  ?FVC-Predicted Pre % 97  ?FVC-Post L 3.07  ?FVC-Predicted Post % 100  ?Pre FEV1/FVC % % 57  ?Post FEV1/FCV % % 60

## 2021-03-26 ENCOUNTER — Telehealth: Payer: Self-pay | Admitting: Pulmonary Disease

## 2021-03-26 NOTE — Telephone Encounter (Signed)
ATC patient to go over inhalers, per DPR left detailed message letting him know the difference between the inhalers and advised him to call back with any questions or concerns. Nothing further needed at this time.  ?

## 2021-04-05 DIAGNOSIS — R350 Frequency of micturition: Secondary | ICD-10-CM | POA: Diagnosis not present

## 2021-05-03 DIAGNOSIS — R3915 Urgency of urination: Secondary | ICD-10-CM | POA: Diagnosis not present

## 2021-05-03 DIAGNOSIS — R350 Frequency of micturition: Secondary | ICD-10-CM | POA: Diagnosis not present

## 2021-05-17 DIAGNOSIS — R350 Frequency of micturition: Secondary | ICD-10-CM | POA: Diagnosis not present

## 2021-06-25 DIAGNOSIS — N401 Enlarged prostate with lower urinary tract symptoms: Secondary | ICD-10-CM | POA: Diagnosis not present

## 2021-07-04 DIAGNOSIS — R972 Elevated prostate specific antigen [PSA]: Secondary | ICD-10-CM | POA: Diagnosis not present

## 2021-07-04 DIAGNOSIS — N5201 Erectile dysfunction due to arterial insufficiency: Secondary | ICD-10-CM | POA: Diagnosis not present

## 2021-07-04 DIAGNOSIS — R350 Frequency of micturition: Secondary | ICD-10-CM | POA: Diagnosis not present

## 2021-07-04 DIAGNOSIS — N401 Enlarged prostate with lower urinary tract symptoms: Secondary | ICD-10-CM | POA: Diagnosis not present

## 2021-07-05 DIAGNOSIS — R350 Frequency of micturition: Secondary | ICD-10-CM | POA: Diagnosis not present

## 2021-07-13 ENCOUNTER — Other Ambulatory Visit (HOSPITAL_COMMUNITY): Payer: Self-pay | Admitting: *Deleted

## 2021-07-16 ENCOUNTER — Ambulatory Visit (HOSPITAL_COMMUNITY)
Admission: RE | Admit: 2021-07-16 | Discharge: 2021-07-16 | Disposition: A | Discharge status: Home / Self Care | Payer: Medicare Other | Source: Ambulatory Visit | Attending: Internal Medicine | Admitting: Internal Medicine

## 2021-07-16 DIAGNOSIS — M810 Age-related osteoporosis without current pathological fracture: Secondary | ICD-10-CM | POA: Insufficient documentation

## 2021-07-16 MED ORDER — DENOSUMAB 60 MG/ML ~~LOC~~ SOSY: 60.0000 mg | PREFILLED_SYRINGE | Freq: Once | SUBCUTANEOUS | Status: DC

## 2021-07-16 MED ORDER — DENOSUMAB 60 MG/ML ~~LOC~~ SOSY
PREFILLED_SYRINGE | SUBCUTANEOUS | Status: AC
  Administered 2021-07-16: 60 mg via SUBCUTANEOUS
  Filled 2021-07-16: qty 1

## 2021-08-02 DIAGNOSIS — R350 Frequency of micturition: Secondary | ICD-10-CM | POA: Diagnosis not present

## 2021-08-15 DIAGNOSIS — I440 Atrioventricular block, first degree: Secondary | ICD-10-CM | POA: Diagnosis not present

## 2021-08-15 DIAGNOSIS — J45909 Unspecified asthma, uncomplicated: Secondary | ICD-10-CM | POA: Diagnosis not present

## 2021-08-15 DIAGNOSIS — C61 Malignant neoplasm of prostate: Secondary | ICD-10-CM | POA: Diagnosis not present

## 2021-08-15 DIAGNOSIS — K219 Gastro-esophageal reflux disease without esophagitis: Secondary | ICD-10-CM | POA: Diagnosis not present

## 2021-08-23 DIAGNOSIS — R350 Frequency of micturition: Secondary | ICD-10-CM | POA: Diagnosis not present

## 2021-09-21 DIAGNOSIS — R3915 Urgency of urination: Secondary | ICD-10-CM | POA: Diagnosis not present

## 2021-09-21 DIAGNOSIS — R350 Frequency of micturition: Secondary | ICD-10-CM | POA: Diagnosis not present

## 2021-09-24 DIAGNOSIS — H524 Presbyopia: Secondary | ICD-10-CM | POA: Diagnosis not present

## 2021-10-18 DIAGNOSIS — R350 Frequency of micturition: Secondary | ICD-10-CM | POA: Diagnosis not present

## 2021-11-15 DIAGNOSIS — R350 Frequency of micturition: Secondary | ICD-10-CM | POA: Diagnosis not present

## 2021-12-13 DIAGNOSIS — R350 Frequency of micturition: Secondary | ICD-10-CM | POA: Diagnosis not present

## 2021-12-19 DIAGNOSIS — D225 Melanocytic nevi of trunk: Secondary | ICD-10-CM | POA: Diagnosis not present

## 2021-12-19 DIAGNOSIS — L821 Other seborrheic keratosis: Secondary | ICD-10-CM | POA: Diagnosis not present

## 2021-12-19 DIAGNOSIS — Z85828 Personal history of other malignant neoplasm of skin: Secondary | ICD-10-CM | POA: Diagnosis not present

## 2021-12-19 DIAGNOSIS — Z85820 Personal history of malignant melanoma of skin: Secondary | ICD-10-CM | POA: Diagnosis not present

## 2022-03-14 ENCOUNTER — Other Ambulatory Visit (HOSPITAL_COMMUNITY): Payer: Self-pay | Admitting: *Deleted

## 2022-03-18 ENCOUNTER — Ambulatory Visit (HOSPITAL_COMMUNITY)
Admission: RE | Admit: 2022-03-18 | Discharge: 2022-03-18 | Disposition: A | Discharge status: Home / Self Care | Payer: Medicare Other | Source: Ambulatory Visit | Attending: Internal Medicine | Admitting: Internal Medicine

## 2022-03-18 DIAGNOSIS — M810 Age-related osteoporosis without current pathological fracture: Secondary | ICD-10-CM | POA: Diagnosis present

## 2022-03-18 MED ORDER — DENOSUMAB 60 MG/ML ~~LOC~~ SOSY
PREFILLED_SYRINGE | SUBCUTANEOUS | Status: AC
  Filled 2022-03-18: qty 1

## 2022-03-18 MED ORDER — DENOSUMAB 60 MG/ML ~~LOC~~ SOSY
60.0000 mg | PREFILLED_SYRINGE | Freq: Once | SUBCUTANEOUS | Status: AC
  Administered 2022-03-18: 60 mg via SUBCUTANEOUS

## 2022-05-16 ENCOUNTER — Other Ambulatory Visit: Payer: Self-pay | Admitting: Pulmonary Disease

## 2022-05-16 DIAGNOSIS — R0981 Nasal congestion: Secondary | ICD-10-CM

## 2022-05-16 DIAGNOSIS — J310 Chronic rhinitis: Secondary | ICD-10-CM

## 2022-07-10 ENCOUNTER — Ambulatory Visit: Payer: Medicare Other | Admitting: Nurse Practitioner

## 2022-07-10 ENCOUNTER — Encounter: Payer: Self-pay | Admitting: Nurse Practitioner

## 2022-07-10 ENCOUNTER — Ambulatory Visit (INDEPENDENT_AMBULATORY_CARE_PROVIDER_SITE_OTHER): Payer: Medicare Other

## 2022-07-10 VITALS — BP 150/78 | HR 71 | Ht 67.0 in | Wt 119.6 lb

## 2022-07-10 DIAGNOSIS — J449 Chronic obstructive pulmonary disease, unspecified: Secondary | ICD-10-CM | POA: Diagnosis not present

## 2022-07-10 DIAGNOSIS — J329 Chronic sinusitis, unspecified: Secondary | ICD-10-CM

## 2022-07-10 DIAGNOSIS — J310 Chronic rhinitis: Secondary | ICD-10-CM | POA: Diagnosis not present

## 2022-07-10 DIAGNOSIS — R0609 Other forms of dyspnea: Secondary | ICD-10-CM

## 2022-07-10 LAB — CBC WITH DIFFERENTIAL/PLATELET
Basophils Absolute: 0.1 10*3/uL (ref 0.0–0.1)
Basophils Relative: 1.1 % (ref 0.0–3.0)
Eosinophils Absolute: 0.5 10*3/uL (ref 0.0–0.7)
Eosinophils Relative: 8.3 % — ABNORMAL HIGH (ref 0.0–5.0)
HCT: 41.3 % (ref 39.0–52.0)
Hemoglobin: 13.6 g/dL (ref 13.0–17.0)
Lymphocytes Relative: 17.9 % (ref 12.0–46.0)
Lymphs Abs: 1.2 10*3/uL (ref 0.7–4.0)
MCHC: 33 g/dL (ref 30.0–36.0)
MCV: 98 fl (ref 78.0–100.0)
Monocytes Absolute: 0.6 10*3/uL (ref 0.1–1.0)
Monocytes Relative: 8.5 % (ref 3.0–12.0)
Neutro Abs: 4.2 10*3/uL (ref 1.4–7.7)
Neutrophils Relative %: 64.2 % (ref 43.0–77.0)
Platelets: 218 10*3/uL (ref 150.0–400.0)
RBC: 4.22 Mil/uL (ref 4.22–5.81)
RDW: 14 % (ref 11.5–15.5)
WBC: 6.5 10*3/uL (ref 4.0–10.5)

## 2022-07-10 LAB — BASIC METABOLIC PANEL
BUN: 16 mg/dL (ref 6–23)
CO2: 32 mEq/L (ref 19–32)
Calcium: 9.8 mg/dL (ref 8.4–10.5)
Chloride: 103 mEq/L (ref 96–112)
Creatinine, Ser: 0.9 mg/dL (ref 0.40–1.50)
GFR: 76.19 mL/min (ref >60.00)
Glucose, Bld: 87 mg/dL (ref 70–99)
Potassium: 3.8 mEq/L (ref 3.5–5.1)
Sodium: 141 mEq/L (ref 135–145)

## 2022-07-10 LAB — TSH: TSH: 2.17 u[IU]/mL (ref 0.35–5.50)

## 2022-07-10 LAB — BRAIN NATRIURETIC PEPTIDE: Pro B Natriuretic peptide (BNP): 82 pg/mL (ref 0.0–100.0)

## 2022-07-10 NOTE — Assessment & Plan Note (Signed)
Mild COPD; previously compensated on ICS/LABA therapy with Advair. No current exacerbation. Question if DOE is related to poor control vs chronic sinus disease given constellation of symptoms. Lung exam clear today. Will obtain CXR to rule out acute process. Trial step up to [facility] to see if he has any perceived benefit. Action plan in place. Encouraged to remain active, as tolerated.  Patient Instructions  Continue Albuterol inhaler 2 puffs every 6 hours as needed for shortness of breath or wheezing. Notify if symptoms persist despite rescue inhaler/neb use.  Continue flonase nasal spray 2 sprays each nostril daily Continue ipratropium 2 sprays twice a day  Continue montelukast (singulair) At bedtime for allergies Continue protonix 1 tab daily   -Start saline nasal rinses (such as Neil Med rinse) 1-2 times a day. Follow with your nasal sprays 20-30 minutes after -Afrin nasal spray (over the counter) for 3-5 days. Do not use longer than this as it can cause worsening symptoms -Guaifenesin 600 mg Twice daily for congestion -Stop Advair. Trial Breztri 2 puffs Twice daily. Brush tongue and rinse mouth afterwards. If you notice a difference in your breathing with this, let me know and I will send a prescription in. If not, you can go back to your Advair 2 puffs Twice daily. Brush tongue and rinse mouth afterwards   Take small, frequent sips of water when eating foods that tend to get stuck   Chest x ray today Labs today   Follow up with Dr. Francine Graven or Florentina Addison Aristea Posada,NP in 4 weeks. If symptoms do not improve or worsen, please contact office for sooner follow up or seek emergency care.

## 2022-07-10 NOTE — Progress Notes (Signed)
@Patient  ID: Charles Mitchell, male    DOB: Aug 23, 1931, 89 y.o.   MRN: [id number]  Chief Complaint  Patient presents with   Follow-up    Sob,coughing (dry)ongoing for 3 months. Pt has been using advair inhaler.    Referring provider: Rodrigo Ran, MD  HPI: 89 year old male, former smoker followed for COPD. He is a patient of Dr. Lanora Manis and last seen in office 03/20/2021. Past medical history significant for prostate cancer, GERD, chronic rhinitis, HLD  TEST/EVENTS:  03/08/2014 CXR: no acute process. Mild left base scarring. 03/20/2021 PFT: FVC 97, FEV1 82, ratio 60, TLC99, DLCOcor 66. No BD  03/20/2021: OV with Dr. Francine Graven. PFT shows mild obstructive defect, no reversibility and mild diffusion defect. Doing well since last visit. Continues on flovent and singualir. Improvement in rhinitis with ipratropium and fluticasone nasal sprays. Previous reported lots of mucus from nose; no sense of taste or smell. Stepped up to Advair.   07/10/2022: Today - follow up Patient presents today for follow up. He tells me that he's been having some more trouble with his breathing over the past 2-3 months. He's been able to go to the gym 5 days a week still and doesn't notice it but then when he comes home and walks up his driveway, he wonders if he has enough air to get up to the top. Cough is mostly if something gets stuck in his throat. He has a lot of sinus congestion still. He felt like it was pollen related but it hasn't gotten any better. He thinks this may be contributing to his breathing. The timeline of worsening was similar to onset of his sinus symptoms. He has struggled with this for a long time. He denies any fevers, chills, hemoptysis, leg swelling, orthopnea, PND, dizziness, palpitations. He is using his Advair. Doesn't use his rescue much. Still using flonase and ipratropium nasal sprays. Taking sinulair and zyrtec.   Allergies  Allergen Reactions   Sulfa Antibiotics Other (See Comments)     Unknown childhood reaction     There is no immunization history on file for this patient.  Past Medical History:  Diagnosis Date   Asthma    BPH (benign prostatic hypertrophy) with urinary retention    Colon cancer ([facility])    documentation refers to rectal ca but pt calls it colon ca   COPD (chronic obstructive pulmonary disease) ([facility])    mgd by pcp  on flovent; per patient able to walk 2 miles per day with minimal dyspnea    Diverticulosis of colon    severe   Eczema    Foley catheter in place    now removed    GERD (gastroesophageal reflux disease)    History of adenomatous polyp of colon    History of melanoma excision    skin   History of rectal cancer    01-24-2004  s/p  excision rectal cancer--  no recurrence   Hyperlipidemia    Prostate cancer ([facility])    Skin cancer    Wears hearing aid in both ears     Tobacco History: Social History   Tobacco Use  Smoking Status Former   Packs/day: 1.00   Years: 30.00   Additional pack years: 0.00   Total pack years: 30.00   Types: Cigarettes   Quit date: 04/14/1983   Years since quitting: 39.2  Smokeless Tobacco Never   Counseling given: Not Answered   Outpatient Medications Prior to Visit  Medication Sig Dispense Refill  cetirizine (ZYRTEC ALLERGY) 10 MG tablet Take 1 tablet (10 mg total) by mouth daily. 30 tablet 6   Cholecalciferol (VITAMIN D3) 50 MCG (2000 UT) capsule 1 capsule     denosumab (PROLIA) 60 MG/ML SOSY injection Inject 60 mg into the skin every 6 (six) months.     fluticasone (FLONASE) 50 MCG/ACT nasal spray Place 2 sprays into both nostrils daily.     fluticasone-salmeterol (ADVAIR HFA) 230-21 MCG/ACT inhaler Inhale 2 puffs into the lungs 2 (two) times daily. 1 each 12   glucosamine-chondroitin 500-400 MG tablet Take by mouth.     ipratropium (ATROVENT) 0.03 % nasal spray Place 2 sprays into both nostrils every twelve hours. 30 mL 12   montelukast (SINGULAIR) 10 MG tablet Take 10 mg by mouth daily.      MYRBETRIQ 50 MG TB24 tablet Take 50 mg by mouth every evening.     pantoprazole (PROTONIX) 40 MG tablet Take 40 mg by mouth daily as needed (for acid reflux/indigestion.).      sildenafil (VIAGRA) 100 MG tablet Take 100 mg by mouth daily as needed for erectile dysfunction.     simvastatin (ZOCOR) 40 MG tablet Take 1 tablet by mouth daily.     vitamin B-12 (CYANOCOBALAMIN) 1000 MCG tablet Take by mouth.     No facility-administered medications prior to visit.     Review of Systems:   Constitutional: No weight loss or gain, night sweats, fevers, chills, fatigue, or lassitude. HEENT: No headaches, difficulty swallowing, tooth/dental problems, or sore throat. No sneezing, itching, ear ache. +nasal congestion/drainage, post nasal drip, decreased taste/smell (chronic) CV:  No chest pain, orthopnea, PND, swelling in lower extremities, anasarca, dizziness, palpitations, syncope Resp: +shortness of breath with exertion; occasional dry cough. No excess mucus or change in color of mucus. No hemoptysis. No wheezing.  No chest wall deformity GI:  No heartburn, indigestion, abdominal pain, nausea, vomiting, diarrhea, change in bowel habits, loss of appetite, bloody stools.  GU: No dysuria, change in color of urine, urgency or frequency.   Skin: No rash, lesions, ulcerations MSK:  No joint pain or swelling.   Neuro: No dizziness or lightheadedness.  Psych: No depression or anxiety. Mood stable.     Physical Exam:  BP (!) 150/78   Pulse 71   Ht 5\' 7"  (1.702 m)   Wt 119 lb 9.6 oz (54.3 kg)   SpO2 95%   BMI 18.73 kg/m   GEN: Pleasant, interactive, well-kempt; in no acute distress. HEENT:  Normocephalic and atraumatic. EACs patent bilaterally. TM pearly gray with present light reflex bilaterally. PERRLA. Sclera white. Nasal turbinates erythematous, moist and patent bilaterally. Clear rhinorrhea present. Oropharynx pink and moist, without exudate or edema. No lesions, ulcerations NECK:  Supple w/  fair ROM. No JVD present. Normal carotid impulses w/o bruits. Thyroid symmetrical with no goiter or nodules palpated. No lymphadenopathy.   CV: RRR, no m/r/g, no peripheral edema. Pulses intact, +2 bilaterally. No cyanosis, pallor or clubbing. PULMONARY:  Unlabored, regular breathing. Clear bilaterally A&P w/o wheezes/rales/rhonchi. No accessory muscle use.  GI: BS present and normoactive. Soft, non-tender to palpation. No organomegaly or masses detected.  MSK: No erythema, warmth or tenderness. Cap refil <2 sec all extrem. No deformities or joint swelling noted.  Neuro: A/Ox3. No focal deficits noted.   Skin: Warm, no lesions or rashe Psych: Normal affect and behavior. Judgement and thought content appropriate.     Lab Results:  CBC    Component Value Date/Time   WBC 6.3  05/22/2018 1118   RBC 3.83 (L) 05/22/2018 1118   HGB 12.9 (L) 05/22/2018 1118   HCT 38.8 (L) 05/22/2018 1118   PLT 225 05/22/2018 1118   MCV 101.3 (H) 05/22/2018 1118   MCH 33.7 05/22/2018 1118   MCHC 33.2 05/22/2018 1118   RDW 13.7 05/22/2018 1118    BMET    Component Value Date/Time   NA 138 03/08/2014 1139   K 3.7 03/08/2014 1139   CL 105 03/08/2014 1139   CO2 27 03/08/2014 1139   GLUCOSE 132 (H) 03/08/2014 1139   BUN 13 03/08/2014 1139   CREATININE 1.02 03/08/2014 1139   CALCIUM 8.4 03/08/2014 1139   GFRNONAA 67 (L) 03/08/2014 1139   GFRAA 78 (L) 03/08/2014 1139    BNP No results found for: "BNP"   Imaging:  No results found.       Latest Ref Rng & Units 03/20/2021   11:57 AM  PFT Results  FVC-Pre L 2.98   FVC-Predicted Pre % 97   FVC-Post L 3.07   FVC-Predicted Post % 100   Pre FEV1/FVC % % 57   Post FEV1/FCV % % 60   FEV1-Pre L 1.71   FEV1-Predicted Pre % 82   FEV1-Post L 1.85   DLCO uncorrected ml/min/mmHg 13.75   DLCO UNC% % 66   DLCO corrected ml/min/mmHg 13.75   DLCO COR %Predicted % 66   DLVA Predicted % 72   TLC L 6.27   TLC % Predicted % 99   RV % Predicted % 122      No results found for: "NITRICOXIDE"      Assessment & Plan:   COPD, mild (HCC) Mild COPD; previously compensated on ICS/LABA therapy with Advair. No current exacerbation. Question if DOE is related to poor control vs chronic sinus disease given constellation of symptoms. Lung exam clear today. Will obtain CXR to rule out acute process. Trial step up to [facility] to see if he has any perceived benefit. Action plan in place. Encouraged to remain active, as tolerated.  Patient Instructions  Continue Albuterol inhaler 2 puffs every 6 hours as needed for shortness of breath or wheezing. Notify if symptoms persist despite rescue inhaler/neb use.  Continue flonase nasal spray 2 sprays each nostril daily Continue ipratropium 2 sprays twice a day  Continue montelukast (singulair) At bedtime for allergies Continue protonix 1 tab daily   -Start saline nasal rinses (such as Neil Med rinse) 1-2 times a day. Follow with your nasal sprays 20-30 minutes after -Afrin nasal spray (over the counter) for 3-5 days. Do not use longer than this as it can cause worsening symptoms -Guaifenesin 600 mg Twice daily for congestion -Stop Advair. Trial Breztri 2 puffs Twice daily. Brush tongue and rinse mouth afterwards. If you notice a difference in your breathing with this, let me know and I will send a prescription in. If not, you can go back to your Advair 2 puffs Twice daily. Brush tongue and rinse mouth afterwards   Take small, frequent sips of water when eating foods that tend to get stuck   Chest x ray today Labs today   Follow up with Dr. Francine Graven or Florentina Addison Tasmin Exantus,NP in 4 weeks. If symptoms do not improve or worsen, please contact office for sooner follow up or seek emergency care.    DOE (dyspnea on exertion) Rule out underlying etiology such as anemia, thyroid disease, cardiac disease. CXR to rule out acute lung process. Suspicion DOE is correlated to sinus disease. Optimize management.  Chronic  rhinitis Chronic rhinitis. Check IgE to assess for allergic phenotype. He is already on daily antihistamine with zyrtec, singulair, flonase, and atrovent. See above. Referral to ENT for further evaluation/recommendations.    I spent 35 minutes of dedicated to the care of this patient on the date of this encounter to include pre-visit review of records, face-to-face time with the patient discussing conditions above, post visit ordering of testing, clinical documentation with the electronic health record, making appropriate referrals as documented, and communicating necessary findings to members of the patients care team.  Noemi Chapel, NP 07/10/2022  Pt aware and understands NP's role.

## 2022-07-10 NOTE — Assessment & Plan Note (Signed)
Chronic rhinitis. Check IgE to assess for allergic phenotype. He is already on daily antihistamine with zyrtec, singulair, flonase, and atrovent. See above. Referral to ENT for further evaluation/recommendations.

## 2022-07-10 NOTE — Patient Instructions (Addendum)
Continue Albuterol inhaler 2 puffs every 6 hours as needed for shortness of breath or wheezing. Notify if symptoms persist despite rescue inhaler/neb use.  Continue flonase nasal spray 2 sprays each nostril daily Continue ipratropium 2 sprays twice a day  Continue montelukast (singulair) At bedtime for allergies Continue protonix 1 tab daily   -Start saline nasal rinses (such as Neil Med rinse) 1-2 times a day. Follow with your nasal sprays 20-30 minutes after -Afrin nasal spray (over the counter) for 3-5 days. Do not use longer than this as it can cause worsening symptoms -Guaifenesin 600 mg Twice daily for congestion -Stop Advair. Trial Breztri 2 puffs Twice daily. Brush tongue and rinse mouth afterwards. If you notice a difference in your breathing with this, let me know and I will send a prescription in. If not, you can go back to your Advair 2 puffs Twice daily. Brush tongue and rinse mouth afterwards   Take small, frequent sips of water when eating foods that tend to get stuck   Chest x ray today Labs today   Follow up with Dr. Francine Graven or Florentina Addison Trasean Delima,NP in 4 weeks. If symptoms do not improve or worsen, please contact office for sooner follow up or seek emergency care.

## 2022-07-10 NOTE — Assessment & Plan Note (Signed)
Rule out underlying etiology such as anemia, thyroid disease, cardiac disease. CXR to rule out acute lung process. Suspicion DOE is correlated to sinus disease. Optimize management.

## 2022-07-11 LAB — IGE: IgE (Immunoglobulin E), Serum: 142 kU/L — ABNORMAL HIGH (ref <=114)

## 2022-07-17 NOTE — Progress Notes (Signed)
Labs revealed allergic component to symptoms with elevated IgE and eosinophils. Continue current regimen. Can discuss at follow up.

## 2022-07-17 NOTE — Progress Notes (Signed)
CXR was clear

## 2022-08-08 ENCOUNTER — Ambulatory Visit: Payer: Medicare Other | Admitting: Nurse Practitioner

## 2022-09-04 ENCOUNTER — Ambulatory Visit: Payer: Medicare Other | Admitting: Nurse Practitioner

## 2022-09-04 ENCOUNTER — Encounter: Payer: Self-pay | Admitting: Nurse Practitioner

## 2022-09-04 VITALS — BP 138/70 | HR 87 | Ht 65.0 in | Wt 112.2 lb

## 2022-09-04 DIAGNOSIS — J449 Chronic obstructive pulmonary disease, unspecified: Secondary | ICD-10-CM

## 2022-09-04 DIAGNOSIS — J310 Chronic rhinitis: Secondary | ICD-10-CM | POA: Diagnosis not present

## 2022-09-04 NOTE — Patient Instructions (Addendum)
-  Continue Albuterol inhaler 2 puffs every 6 hours as needed for shortness of breath or wheezing. Notify if symptoms persist despite rescue inhaler/neb use.  -Continue flonase nasal spray 2 sprays each nostril daily -Continue ipratropium 2 sprays twice a day  -Continue montelukast (singulair) At bedtime for allergies -Continue protonix 1 tab daily  -Continue saline nasal rinses (such as Neil Med rinse) 1-2 times a day. Follow with your nasal sprays 20-30 minutes after -Continue Guaifenesin 600 mg Twice daily for congestion -Continue Advair 2 puffs Twice daily. Brush tongue and rinse mouth afterwards.  -Continue zyrtec 1 tab daily. If you have more trouble in the fall, you should try switching to claritin, xyzal or allegra   -You can use Afrin nasal spray (over the counter) as needed for worsened nasal congestion. Do not use longer than this as it can cause worsening symptoms -Saline nasal gel at bedtime if you need to at bedtime for mouth dryness   Take small, frequent sips of water when eating foods that tend to get stuck    Follow up with Dr. Francine Mitchell or Charles Addison Sonjia Wilcoxson,NP in 4 months. If symptoms do not improve or worsen, please contact office for sooner follow up or seek emergency care.

## 2022-09-04 NOTE — Assessment & Plan Note (Signed)
Compensated on current regimen. No change with step up to Trelegy. Improvement with targeting nasal symptoms. Back on ICS/LABA. Action plan in place. Encouraged continued activity. Small, frequent, high protein meals.

## 2022-09-04 NOTE — Progress Notes (Signed)
@Patient  ID: Charles Mitchell, male    DOB: 06-02-1931, 89 y.o.   MRN: [id number]  Chief Complaint  Patient presents with   Follow-up    Pt is here for SOB/Cough F/U visit. Pt states he has no SOB and no Coughing. Pt wants to discuss medications.    Referring provider: Rodrigo Ran, MD  HPI: 89 year old male, former smoker followed for COPD. He is a patient of Dr. Lanora Manis and last seen in office 07/10/2022. Past medical history significant for prostate cancer, GERD, chronic rhinitis, HLD  TEST/EVENTS:  03/08/2014 CXR: no acute process. Mild left base scarring. 03/20/2021 PFT: FVC 97, FEV1 82, ratio 60, TLC99, DLCOcor 66. No BD  03/20/2021: OV with Dr. Francine Graven. PFT shows mild obstructive defect, no reversibility and mild diffusion defect. Doing well since last visit. Continues on flovent and singualir. Improvement in rhinitis with ipratropium and fluticasone nasal sprays. Previous reported lots of mucus from nose; no sense of taste or smell. Stepped up to Advair.   07/10/2022: OV with Tiziana Cislo NP for follow up. He tells me that he's been having some more trouble with his breathing over the past 2-3 months. He's been able to go to the gym 5 days a week still and doesn't notice it but then when he comes home and walks up his driveway, he wonders if he has enough air to get up to the top. Cough is mostly if something gets stuck in his throat. He has a lot of sinus congestion still. He felt like it was pollen related but it hasn't gotten any better. He thinks this may be contributing to his breathing. The timeline of worsening was similar to onset of his sinus symptoms. He has struggled with this for a long time. He denies any fevers, chills, hemoptysis, leg swelling, orthopnea, PND, dizziness, palpitations. He is using his Advair. Doesn't use his rescue much. Still using flonase and ipratropium nasal sprays. Taking sinulair and zyrtec.   09/04/2022: Today - follow up Patient presents today for follow up.  Doing much better. He feels like the saline rinses and mucinex have made a huge difference. Nose feels more open and his taste and smell have started to come back again too. Cough is gone. Not really having to clear his throat at all anymore. Breathing doing well. Goes to the gym 5 days a week. Still using flonase and ipratropium nasal sprays. Didn't notice a difference with step up to Trelegy so went back to Advair. Not having to use albuterol.    Allergies  Allergen Reactions   Sulfa Antibiotics Other (See Comments)    Unknown childhood reaction     There is no immunization history on file for this patient.  Past Medical History:  Diagnosis Date   Asthma    BPH (benign prostatic hypertrophy) with urinary retention    Colon cancer (HCC)    documentation refers to rectal ca but pt calls it colon ca   COPD (chronic obstructive pulmonary disease) (HCC)    mgd by pcp  on flovent; per patient able to walk 2 miles per day with minimal dyspnea    Diverticulosis of colon    severe   Eczema    Foley catheter in place    now removed    GERD (gastroesophageal reflux disease)    History of adenomatous polyp of colon    History of melanoma excision    skin   History of rectal cancer    01-24-2004  s/p  excision rectal cancer--  no recurrence   Hyperlipidemia    Prostate cancer (HCC)    Skin cancer    Wears hearing aid in both ears     Tobacco History: Social History   Tobacco Use  Smoking Status Former   Current packs/day: 0.00   Average packs/day: 1 pack/day for 30.0 years (30.0 ttl pk-yrs)   Types: Cigarettes   Start date: 04/13/1953   Quit date: 04/14/1983   Years since quitting: 39.4  Smokeless Tobacco Never   Counseling given: Not Answered   Outpatient Medications Prior to Visit  Medication Sig Dispense Refill   cetirizine (ZYRTEC ALLERGY) 10 MG tablet Take 1 tablet (10 mg total) by mouth daily. 30 tablet 6   Cholecalciferol (VITAMIN D3) 50 MCG (2000 UT) capsule 1 capsule      denosumab (PROLIA) 60 MG/ML SOSY injection Inject 60 mg into the skin every 6 (six) months.     fluticasone-salmeterol (ADVAIR HFA) 230-21 MCG/ACT inhaler Inhale 2 puffs into the lungs 2 (two) times daily. 1 each 12   glucosamine-chondroitin 500-400 MG tablet Take by mouth.     ipratropium (ATROVENT) 0.03 % nasal spray Place 2 sprays into both nostrils every twelve hours. 30 mL 12   montelukast (SINGULAIR) 10 MG tablet Take 10 mg by mouth daily.     MYRBETRIQ 50 MG TB24 tablet Take 50 mg by mouth every evening.     pantoprazole (PROTONIX) 40 MG tablet Take 40 mg by mouth daily as needed (for acid reflux/indigestion.).      sildenafil (VIAGRA) 100 MG tablet Take 100 mg by mouth daily as needed for erectile dysfunction.     simvastatin (ZOCOR) 40 MG tablet Take 1 tablet by mouth daily.     vitamin B-12 (CYANOCOBALAMIN) 1000 MCG tablet Take by mouth.     fluticasone (FLONASE) 50 MCG/ACT nasal spray Place 2 sprays into both nostrils daily.     No facility-administered medications prior to visit.     Review of Systems:   Constitutional: No weight loss or gain, night sweats, fevers, chills, fatigue, or lassitude. HEENT: No headaches, difficulty swallowing, tooth/dental problems, or sore throat. No sneezing, itching, ear ache. +nasal congestion/drainage (significant improvement) decreased taste/smell (improved) CV:  No chest pain, orthopnea, PND, swelling in lower extremities, anasarca, dizziness, palpitations, syncope Resp: No shortness of breath with exertion. No cough. No excess mucus or change in color of mucus. No hemoptysis. No wheezing.  No chest wall deformity GI:  No heartburn, indigestion, abdominal pain, nausea, vomiting, diarrhea, change in bowel habits, loss of appetite, bloody stools.  GU: No dysuria, change in color of urine, urgency or frequency.   Skin: No rash, lesions, ulcerations MSK:  No joint pain or swelling.   Neuro: No dizziness or lightheadedness.  Psych: No  depression or anxiety. Mood stable.     Physical Exam:  BP 138/70 (BP Location: Right Arm, Cuff Size: Normal)   Pulse 87   Ht 5\' 5"  (1.651 m)   Wt 112 lb 3.2 oz (50.9 kg)   SpO2 96%   BMI 18.67 kg/m   GEN: Pleasant, interactive, well-kempt; in no acute distress. HEENT:  Normocephalic and atraumatic. EACs patent bilaterally. TM pearly gray with present light reflex bilaterally. PERRLA. Sclera white. Nasal turbinates pale, moist and patent bilaterally. No rhinorrhea present. Oropharynx pink and moist, without exudate or edema. No lesions, ulcerations NECK:  Supple w/ fair ROM. No JVD present. Normal carotid impulses w/o bruits. Thyroid symmetrical with no goiter or nodules palpated.  No lymphadenopathy.   CV: RRR, no m/r/g, no peripheral edema. Pulses intact, +2 bilaterally. No cyanosis, pallor or clubbing. PULMONARY:  Unlabored, regular breathing. Clear bilaterally A&P w/o wheezes/rales/rhonchi. No accessory muscle use.  GI: BS present and normoactive. Soft, non-tender to palpation. No organomegaly or masses detected.  MSK: No erythema, warmth or tenderness. Cap refil <2 sec all extrem. No deformities or joint swelling noted.  Neuro: A/Ox3. No focal deficits noted.   Skin: Warm, no lesions or rashe Psych: Normal affect and behavior. Judgement and thought content appropriate.     Lab Results:  CBC    Component Value Date/Time   WBC 6.5 07/10/2022 1113   RBC 4.22 07/10/2022 1113   HGB 13.6 07/10/2022 1113   HCT 41.3 07/10/2022 1113   PLT 218.0 07/10/2022 1113   MCV 98.0 07/10/2022 1113   MCH 33.7 05/22/2018 1118   MCHC 33.0 07/10/2022 1113   RDW 14.0 07/10/2022 1113   LYMPHSABS 1.2 07/10/2022 1113   MONOABS 0.6 07/10/2022 1113   EOSABS 0.5 07/10/2022 1113   BASOSABS 0.1 07/10/2022 1113    BMET    Component Value Date/Time   NA 141 07/10/2022 1113   K 3.8 07/10/2022 1113   CL 103 07/10/2022 1113   CO2 32 07/10/2022 1113   GLUCOSE 87 07/10/2022 1113   BUN 16  07/10/2022 1113   CREATININE 0.90 07/10/2022 1113   CALCIUM 9.8 07/10/2022 1113   GFRNONAA 67 (L) 03/08/2014 1139   GFRAA 78 (L) 03/08/2014 1139    BNP No results found for: "BNP"   Imaging:  No results found.  Administration History     None          Latest Ref Rng & Units 03/20/2021   11:57 AM  PFT Results  FVC-Pre L 2.98   FVC-Predicted Pre % 97   FVC-Post L 3.07   FVC-Predicted Post % 100   Pre FEV1/FVC % % 57   Post FEV1/FCV % % 60   FEV1-Pre L 1.71   FEV1-Predicted Pre % 82   FEV1-Post L 1.85   DLCO uncorrected ml/min/mmHg 13.75   DLCO UNC% % 66   DLCO corrected ml/min/mmHg 13.75   DLCO COR %Predicted % 66   DLVA Predicted % 72   TLC L 6.27   TLC % Predicted % 99   RV % Predicted % 122     No results found for: "NITRICOXIDE"      Assessment & Plan:   Chronic rhinitis Allergic component with peripheral eosinophils and elevated IgE on testing. He will continue current regimen as he has had significant improvement. Monitor during allergy season. May need to rotate daily antihistamines. Not using Afrin - understands to only use for 3-5 days at a time if he does use this to avoid rebound rhinitis.   Patient Instructions  -Continue Albuterol inhaler 2 puffs every 6 hours as needed for shortness of breath or wheezing. Notify if symptoms persist despite rescue inhaler/neb use.  -Continue flonase nasal spray 2 sprays each nostril daily -Continue ipratropium 2 sprays twice a day  -Continue montelukast (singulair) At bedtime for allergies -Continue protonix 1 tab daily  -Continue saline nasal rinses (such as Neil Med rinse) 1-2 times a day. Follow with your nasal sprays 20-30 minutes after -Continue Guaifenesin 600 mg Twice daily for congestion -Continue Advair 2 puffs Twice daily. Brush tongue and rinse mouth afterwards.  -Continue zyrtec 1 tab daily. If you have more trouble in the fall, you should try switching to  claritin, xyzal or allegra   -You can  use Afrin nasal spray (over the counter) as needed for worsened nasal congestion. Do not use longer than this as it can cause worsening symptoms -Saline nasal gel at bedtime if you need to at bedtime for mouth dryness   Take small, frequent sips of water when eating foods that tend to get stuck    Follow up with Dr. Francine Graven or Florentina Addison Dejohn Ibarra,NP in 4 months. If symptoms do not improve or worsen, please contact office for sooner follow up or seek emergency care.    COPD, mild (HCC) Compensated on current regimen. No change with step up to Trelegy. Improvement with targeting nasal symptoms. Back on ICS/LABA. Action plan in place. Encouraged continued activity. Small, frequent, high protein meals.     I spent 28 minutes of dedicated to the care of this patient on the date of this encounter to include pre-visit review of records, face-to-face time with the patient discussing conditions above, post visit ordering of testing, clinical documentation with the electronic health record, making appropriate referrals as documented, and communicating necessary findings to members of the patients care team.  Noemi Chapel, NP 09/04/2022  Pt aware and understands NP's role.

## 2022-09-04 NOTE — Assessment & Plan Note (Addendum)
Allergic component with peripheral eosinophils and elevated IgE on testing. He will continue current regimen as he has had significant improvement. Monitor during allergy season. May need to rotate daily antihistamines. Not using Afrin - understands to only use for 3-5 days at a time if he does use this to avoid rebound rhinitis.   Patient Instructions  -Continue Albuterol inhaler 2 puffs every 6 hours as needed for shortness of breath or wheezing. Notify if symptoms persist despite rescue inhaler/neb use.  -Continue flonase nasal spray 2 sprays each nostril daily -Continue ipratropium 2 sprays twice a day  -Continue montelukast (singulair) At bedtime for allergies -Continue protonix 1 tab daily  -Continue saline nasal rinses (such as Neil Med rinse) 1-2 times a day. Follow with your nasal sprays 20-30 minutes after -Continue Guaifenesin 600 mg Twice daily for congestion -Continue Advair 2 puffs Twice daily. Brush tongue and rinse mouth afterwards.  -Continue zyrtec 1 tab daily. If you have more trouble in the fall, you should try switching to claritin, xyzal or allegra   -You can use Afrin nasal spray (over the counter) as needed for worsened nasal congestion. Do not use longer than this as it can cause worsening symptoms -Saline nasal gel at bedtime if you need to at bedtime for mouth dryness   Take small, frequent sips of water when eating foods that tend to get stuck    Follow up with Dr. Francine Graven or Florentina Addison Kayanna Mckillop,NP in 4 months. If symptoms do not improve or worsen, please contact office for sooner follow up or seek emergency care.

## 2022-09-18 ENCOUNTER — Telehealth: Payer: Self-pay | Admitting: Nurse Practitioner

## 2022-09-18 DIAGNOSIS — J449 Chronic obstructive pulmonary disease, unspecified: Secondary | ICD-10-CM

## 2022-09-18 NOTE — Telephone Encounter (Signed)
Patient states needs refill for Advair inhaler. States needs prior authorization. Pharmacy is [facility]. Patient phone number is [phone].

## 2022-09-19 ENCOUNTER — Other Ambulatory Visit (HOSPITAL_COMMUNITY): Payer: Self-pay

## 2022-09-19 NOTE — Telephone Encounter (Signed)
PA not needed for Advair. Test claim shows co-pay of $47.00

## 2022-09-20 MED ORDER — FLUTICASONE-SALMETEROL 230-21 MCG/ACT IN AERO
2.0000 | INHALATION_SPRAY | Freq: Two times a day (BID) | RESPIRATORY_TRACT | 12 refills | Status: DC
Start: 2022-09-20 — End: 2023-02-28

## 2022-09-20 NOTE — Telephone Encounter (Signed)
Called pt's pharmacy. Was told that they were needing new Rx for pt's inhaler. Rx refilled. Nothing further needed.

## 2022-12-02 ENCOUNTER — Telehealth: Payer: Self-pay | Admitting: Nurse Practitioner

## 2022-12-02 NOTE — Telephone Encounter (Signed)
Pt has questions about ipratropium (ATROVENT) 0.03 % nasal spray

## 2022-12-03 NOTE — Telephone Encounter (Signed)
Patient is calling back. States his head has been stopped up for a week, he is having breathing issues especially at night. Please advise.

## 2022-12-03 NOTE — Telephone Encounter (Signed)
Atc pt x2 no naswer, lvmm for pt to give the office a call back

## 2022-12-03 NOTE — Telephone Encounter (Signed)
Called pt and there was no answer-LMTCB

## 2022-12-04 NOTE — Telephone Encounter (Signed)
Atc pt x3 no answer, will close encounter

## 2022-12-17 ENCOUNTER — Other Ambulatory Visit: Payer: Self-pay | Admitting: Family Medicine

## 2022-12-17 DIAGNOSIS — J329 Chronic sinusitis, unspecified: Secondary | ICD-10-CM

## 2022-12-23 ENCOUNTER — Other Ambulatory Visit: Payer: Medicare Other

## 2023-01-03 ENCOUNTER — Other Ambulatory Visit: Payer: Medicare Other

## 2023-01-06 ENCOUNTER — Ambulatory Visit: Payer: Medicare Other | Admitting: Nurse Practitioner

## 2023-01-07 ENCOUNTER — Ambulatory Visit
Admission: RE | Admit: 2023-01-07 | Discharge: 2023-01-07 | Disposition: A | Discharge status: Home / Self Care | Payer: Medicare Other | Source: Ambulatory Visit | Attending: Family Medicine | Admitting: Family Medicine

## 2023-01-07 DIAGNOSIS — J329 Chronic sinusitis, unspecified: Secondary | ICD-10-CM

## 2023-02-18 ENCOUNTER — Other Ambulatory Visit (HOSPITAL_COMMUNITY): Payer: Self-pay

## 2023-02-18 ENCOUNTER — Telehealth: Payer: Self-pay

## 2023-02-18 DIAGNOSIS — J449 Chronic obstructive pulmonary disease, unspecified: Secondary | ICD-10-CM

## 2023-02-18 NOTE — Telephone Encounter (Signed)
*  Pulm  Pharmacy Patient Advocate Encounter   Received notification from Fax that prior authorization for Advair HFA is required/requested.   Insurance verification completed.   The patient is insured through [employer] .   Per test claim:  SYMBICORT;BREO;FLUTICASONE/SALMETEROL (ADVAIR DISKUS),DULERA is preferred by the insurance.  If suggested medication is appropriate, Please send in a new RX and discontinue this one. If not, please advise as to why it's not appropriate so that we may request a Prior Authorization. Please note, some preferred medications may still require a PA.  If the suggested medications have not been trialed and there are no contraindications to their use, the PA will not be submitted, as it will not be approved.

## 2023-02-28 MED ORDER — BUDESONIDE-FORMOTEROL FUMARATE 160-4.5 MCG/ACT IN AERO: 2.0000 | INHALATION_SPRAY | Freq: Two times a day (BID) | RESPIRATORY_TRACT | 11 refills | Status: AC

## 2023-02-28 NOTE — Telephone Encounter (Signed)
ATC no vmail

## 2023-02-28 NOTE — Telephone Encounter (Signed)
I sent in new rx for Symbicort 160 mcg 2 puffs Twice daily to replace the Advair since it's not covered by insurance. Side effect profile is the same. Brush tongue and rinse mouth afterwards. Thanks.

## 2023-03-04 NOTE — Telephone Encounter (Signed)
 LMTCB

## 2023-04-07 ENCOUNTER — Other Ambulatory Visit: Payer: Self-pay | Admitting: Otolaryngology

## 2023-04-08 ENCOUNTER — Other Ambulatory Visit: Payer: Self-pay

## 2023-04-08 ENCOUNTER — Encounter (HOSPITAL_COMMUNITY): Payer: Self-pay | Admitting: Otolaryngology

## 2023-04-08 NOTE — Progress Notes (Signed)
 PCP - Dr Rodrigo Ran Cardiologist - none Pulmonology - Micheline Maze, NP Urology - Dr Jerilee Field  Chest x-ray - 07/10/22 EKG - n/a Stress Test - n/a ECHO - n/a Cardiac Cath - n/a  ICD Pacemaker/Loop - n/a  Sleep Study -  n/a  Diabetes - n/a  Aspirin & Blood Thinner Instructions:  n/a  ERAS - clear liquids til 5:30 AM DOS.  Anesthesia review: no  STOP now taking any Aspirin (unless otherwise instructed by your surgeon), Aleve, Naproxen, Ibuprofen, Motrin, Advil, Goody's, BC's, all herbal medications, fish oil, and all vitamins.   Coronavirus Screening Do you have any of the following symptoms:  Cough yes/no: No Fever (>100.65F)  yes/no: No Runny nose Yes Sore throat yes/no: No Difficulty breathing/shortness of breath  Yes  Have you traveled in the last 14 days and where? yes/no: No  Patient verbalized understanding of instructions that were given via phone.

## 2023-04-08 NOTE — Anesthesia Preprocedure Evaluation (Signed)
 Anesthesia Evaluation  Patient identified by MRN, date of birth, ID band Patient awake    Reviewed: Allergy & Precautions, NPO status , Patient's Chart, lab work & pertinent test results  Airway Mallampati: II  TM Distance: >3 FB Neck ROM: Full    Dental no notable dental hx. (+) Teeth Intact, Dental Advisory Given, Implants   Pulmonary asthma , former smoker   Pulmonary exam normal breath sounds clear to auscultation       Cardiovascular (-) hypertension(-) angina (-) Past MI Normal cardiovascular exam Rhythm:Regular Rate:Normal     Neuro/Psych  negative psych ROS   GI/Hepatic ,GERD  Medicated and Controlled,,  Endo/Other    Renal/GU      Musculoskeletal  (+) Arthritis ,    Abdominal   Peds  Hematology   Anesthesia Other Findings All: SUlfa  Reproductive/Obstetrics                             Anesthesia Physical Anesthesia Plan  ASA: 2  Anesthesia Plan: General   Post-op Pain Management: Precedex   Induction: Intravenous  PONV Risk Score and Plan: 3 and Treatment may vary due to age or medical condition and Ondansetron  Airway Management Planned: Oral ETT  Additional Equipment: None  Intra-op Plan:   Post-operative Plan: Extubation in OR  Informed Consent: I have reviewed the patients History and Physical, chart, labs and discussed the procedure including the risks, benefits and alternatives for the proposed anesthesia with the patient or authorized representative who has indicated his/her understanding and acceptance.     Dental advisory given  Plan Discussed with: CRNA and Surgeon  Anesthesia Plan Comments:        Anesthesia Quick Evaluation

## 2023-04-09 ENCOUNTER — Ambulatory Visit (HOSPITAL_COMMUNITY): Admitting: Anesthesiology

## 2023-04-09 ENCOUNTER — Ambulatory Visit (HOSPITAL_COMMUNITY)
Admission: RE | Admit: 2023-04-09 | Discharge: 2023-04-09 | Disposition: A | Discharge status: Home / Self Care | Source: Ambulatory Visit | Attending: Otolaryngology | Admitting: Otolaryngology

## 2023-04-09 ENCOUNTER — Other Ambulatory Visit: Payer: Self-pay

## 2023-04-09 ENCOUNTER — Encounter (HOSPITAL_COMMUNITY)
Admission: RE | Disposition: A | Discharge status: Home / Self Care | Payer: Self-pay | Source: Ambulatory Visit | Attending: Otolaryngology

## 2023-04-09 ENCOUNTER — Ambulatory Visit (HOSPITAL_BASED_OUTPATIENT_CLINIC_OR_DEPARTMENT_OTHER): Admitting: Anesthesiology

## 2023-04-09 DIAGNOSIS — J45909 Unspecified asthma, uncomplicated: Secondary | ICD-10-CM | POA: Diagnosis not present

## 2023-04-09 DIAGNOSIS — J339 Nasal polyp, unspecified: Secondary | ICD-10-CM | POA: Diagnosis present

## 2023-04-09 DIAGNOSIS — Z87891 Personal history of nicotine dependence: Secondary | ICD-10-CM | POA: Diagnosis not present

## 2023-04-09 DIAGNOSIS — J324 Chronic pansinusitis: Secondary | ICD-10-CM | POA: Insufficient documentation

## 2023-04-09 DIAGNOSIS — K219 Gastro-esophageal reflux disease without esophagitis: Secondary | ICD-10-CM | POA: Insufficient documentation

## 2023-04-09 DIAGNOSIS — M1990 Unspecified osteoarthritis, unspecified site: Secondary | ICD-10-CM | POA: Diagnosis not present

## 2023-04-09 HISTORY — PX: MAXILLARY ANTROSTOMY: SHX2003

## 2023-04-09 HISTORY — PX: ETHMOIDECTOMY: SHX5197

## 2023-04-09 HISTORY — PX: NASAL SINUS SURGERY: SHX719

## 2023-04-09 HISTORY — PX: IMAGE GUIDED SINUS SURGERY: SHX6570

## 2023-04-09 HISTORY — PX: FRONTAL SINUS EXPLORATION: SHX6591

## 2023-04-09 HISTORY — DX: Personal history of urinary calculi: Z87.442

## 2023-04-09 HISTORY — DX: Dyspnea, unspecified: R06.00

## 2023-04-09 LAB — CBC
HCT: 34.6 % — ABNORMAL LOW (ref 39.0–52.0)
Hemoglobin: 11.8 g/dL — ABNORMAL LOW (ref 13.0–17.0)
MCH: 33.1 pg (ref 26.0–34.0)
MCHC: 34.1 g/dL (ref 30.0–36.0)
MCV: 96.9 fL (ref 80.0–100.0)
Platelets: 221 10*3/uL (ref 150–400)
RBC: 3.57 MIL/uL — ABNORMAL LOW (ref 4.22–5.81)
RDW: 13.2 % (ref 11.5–15.5)
WBC: 5.6 10*3/uL (ref 4.0–10.5)
nRBC: 0 % (ref 0.0–0.2)

## 2023-04-09 SURGERY — ETHMOIDECTOMY
Anesthesia: General | Site: Nose | Laterality: Bilateral

## 2023-04-09 MED ORDER — LIDOCAINE-EPINEPHRINE 1 %-1:100000 IJ SOLN
INTRAMUSCULAR | Status: DC | PRN
  Administered 2023-04-09: 5 mL

## 2023-04-09 MED ORDER — PHENYLEPHRINE 80 MCG/ML (10ML) SYRINGE FOR IV PUSH (FOR BLOOD PRESSURE SUPPORT)
PREFILLED_SYRINGE | INTRAVENOUS | Status: DC | PRN
  Administered 2023-04-09: 100 ug via INTRAVENOUS

## 2023-04-09 MED ORDER — LACTATED RINGERS IV SOLN
INTRAVENOUS | Status: DC
Start: 2023-04-09 — End: 2023-04-09

## 2023-04-09 MED ORDER — ONDANSETRON HCL 4 MG/2ML IJ SOLN
INTRAMUSCULAR | Status: AC
  Filled 2023-04-09: qty 2

## 2023-04-09 MED ORDER — AMOXICILLIN-POT CLAVULANATE 875-125 MG PO TABS: 1.0000 | ORAL_TABLET | Freq: Two times a day (BID) | ORAL | 0 refills | Status: AC

## 2023-04-09 MED ORDER — MUPIROCIN 2 % EX OINT
TOPICAL_OINTMENT | CUTANEOUS | Status: AC
  Filled 2023-04-09: qty 22

## 2023-04-09 MED ORDER — ROCURONIUM BROMIDE 100 MG/10ML IV SOLN
INTRAVENOUS | Status: DC | PRN
  Administered 2023-04-09: 10 mg via INTRAVENOUS
  Administered 2023-04-09: 40 mg via INTRAVENOUS

## 2023-04-09 MED ORDER — PHENYLEPHRINE 80 MCG/ML (10ML) SYRINGE FOR IV PUSH (FOR BLOOD PRESSURE SUPPORT)
PREFILLED_SYRINGE | INTRAVENOUS | Status: AC
  Filled 2023-04-09: qty 20

## 2023-04-09 MED ORDER — ROCURONIUM BROMIDE 10 MG/ML (PF) SYRINGE
PREFILLED_SYRINGE | INTRAVENOUS | Status: AC
  Filled 2023-04-09: qty 10

## 2023-04-09 MED ORDER — PHENYLEPHRINE HCL (PRESSORS) 10 MG/ML IV SOLN
INTRAVENOUS | Status: AC
  Filled 2023-04-09: qty 1

## 2023-04-09 MED ORDER — PHENYLEPHRINE HCL (PRESSORS) 10 MG/ML IV SOLN: INTRAVENOUS | Status: DC | PRN

## 2023-04-09 MED ORDER — 0.9 % SODIUM CHLORIDE (POUR BTL) OPTIME
TOPICAL | Status: DC | PRN
  Administered 2023-04-09: 1000 mL

## 2023-04-09 MED ORDER — PROPOFOL 10 MG/ML IV BOLUS
INTRAVENOUS | Status: AC
  Filled 2023-04-09: qty 20

## 2023-04-09 MED ORDER — SODIUM CHLORIDE 0.9 % IR SOLN
Status: DC | PRN
  Administered 2023-04-09: 250 mL

## 2023-04-09 MED ORDER — PHENYLEPHRINE HCL-NACL 20-0.9 MG/250ML-% IV SOLN
INTRAVENOUS | Status: DC | PRN
  Administered 2023-04-09: 10 ug/min via INTRAVENOUS

## 2023-04-09 MED ORDER — SUGAMMADEX SODIUM 200 MG/2ML IV SOLN
INTRAVENOUS | Status: DC | PRN
  Administered 2023-04-09: 200 mg via INTRAVENOUS

## 2023-04-09 MED ORDER — ONDANSETRON HCL 4 MG/2ML IJ SOLN: 4.0000 mg | Freq: Once | INTRAMUSCULAR | Status: DC | PRN

## 2023-04-09 MED ORDER — OXYMETAZOLINE HCL 0.05 % NA SOLN
NASAL | Status: AC
  Filled 2023-04-09: qty 30

## 2023-04-09 MED ORDER — FENTANYL CITRATE (PF) 100 MCG/2ML IJ SOLN: 25.0000 ug | INTRAMUSCULAR | Status: DC | PRN

## 2023-04-09 MED ORDER — ONDANSETRON HCL 4 MG/2ML IJ SOLN
INTRAMUSCULAR | Status: DC | PRN
  Administered 2023-04-09: 4 mg via INTRAVENOUS

## 2023-04-09 MED ORDER — FENTANYL CITRATE (PF) 250 MCG/5ML IJ SOLN
INTRAMUSCULAR | Status: AC
  Filled 2023-04-09: qty 5

## 2023-04-09 MED ORDER — MUPIROCIN 2 % EX OINT
TOPICAL_OINTMENT | CUTANEOUS | Status: DC | PRN
  Administered 2023-04-09: 1 via NASAL

## 2023-04-09 MED ORDER — OXYMETAZOLINE HCL 0.05 % NA SOLN
NASAL | Status: DC | PRN
  Administered 2023-04-09: 1 via TOPICAL

## 2023-04-09 MED ORDER — FENTANYL CITRATE (PF) 100 MCG/2ML IJ SOLN
INTRAMUSCULAR | Status: DC | PRN
  Administered 2023-04-09 (×3): 25 ug via INTRAVENOUS

## 2023-04-09 MED ORDER — PREDNISONE 10 MG PO TABS: ORAL_TABLET | ORAL | 0 refills | Status: AC

## 2023-04-09 MED ORDER — CEFAZOLIN SODIUM-DEXTROSE 2-4 GM/100ML-% IV SOLN
2.0000 g | INTRAVENOUS | Status: AC
  Administered 2023-04-09: 2 g via INTRAVENOUS
  Filled 2023-04-09: qty 100

## 2023-04-09 MED ORDER — DEXAMETHASONE SODIUM PHOSPHATE 10 MG/ML IJ SOLN
INTRAMUSCULAR | Status: AC
  Filled 2023-04-09: qty 1

## 2023-04-09 MED ORDER — LIDOCAINE HCL (CARDIAC) PF 100 MG/5ML IV SOSY
PREFILLED_SYRINGE | INTRAVENOUS | Status: DC | PRN
  Administered 2023-04-09: 60 mg via INTRAVENOUS

## 2023-04-09 MED ORDER — LIDOCAINE 2% (20 MG/ML) 5 ML SYRINGE
INTRAMUSCULAR | Status: AC
Start: 2023-04-09 — End: ?
  Filled 2023-04-09: qty 5

## 2023-04-09 MED ORDER — ACETAMINOPHEN 10 MG/ML IV SOLN
INTRAVENOUS | Status: AC
  Filled 2023-04-09: qty 100

## 2023-04-09 MED ORDER — PROPOFOL 10 MG/ML IV BOLUS
INTRAVENOUS | Status: DC | PRN
  Administered 2023-04-09: 100 mg via INTRAVENOUS

## 2023-04-09 MED ORDER — LIDOCAINE-EPINEPHRINE 1 %-1:100000 IJ SOLN
INTRAMUSCULAR | Status: AC
  Filled 2023-04-09: qty 1

## 2023-04-09 MED ORDER — ORAL CARE MOUTH RINSE: 15.0000 mL | Freq: Once | OROMUCOSAL | Status: AC

## 2023-04-09 MED ORDER — DEXAMETHASONE SODIUM PHOSPHATE 4 MG/ML IJ SOLN
INTRAMUSCULAR | Status: DC | PRN
  Administered 2023-04-09: 8 mg via INTRAVENOUS

## 2023-04-09 MED ORDER — CHLORHEXIDINE GLUCONATE 0.12 % MT SOLN
15.0000 mL | Freq: Once | OROMUCOSAL | Status: AC
  Administered 2023-04-09: 15 mL via OROMUCOSAL
  Filled 2023-04-09: qty 15

## 2023-04-09 MED ORDER — ACETAMINOPHEN 10 MG/ML IV SOLN
1000.0000 mg | Freq: Once | INTRAVENOUS | Status: DC | PRN
  Administered 2023-04-09: 1000 mg via INTRAVENOUS

## 2023-04-09 SURGICAL SUPPLY — 43 items
BLADE RAD40 ROTATE 4M 4 5PK (BLADE) IMPLANT
BLADE RAD60 ROTATE M4 4 5PK (BLADE) IMPLANT
BLADE ROTATE TRICUT 4X13 M4 (BLADE) IMPLANT
BLADE TRICUT ROTATE M4 4 5PK (BLADE) ×1 IMPLANT
CANISTER SUC SOCK COL 7 IN (MISCELLANEOUS) IMPLANT
CANISTER SUCT 3000ML PPV (MISCELLANEOUS) ×1 IMPLANT
COAGULATOR SUCT SWTCH 10FR 6 (ELECTROSURGICAL) IMPLANT
COVER LIGHT HANDLE STERIS (MISCELLANEOUS) IMPLANT
DRAPE HALF SHEET 40X57 (DRAPES) IMPLANT
DRESSING MEROCEL 8CM (GAUZE/BANDAGES/DRESSINGS) IMPLANT
DRESSING NASAL POPE 10X1.5X2.5 (GAUZE/BANDAGES/DRESSINGS) IMPLANT
DRSG MEROCEL 8CM (GAUZE/BANDAGES/DRESSINGS) IMPLANT
DRSG NASAL POPE 10X1.5X2.5 (GAUZE/BANDAGES/DRESSINGS) IMPLANT
DRSG NASOPORE 8CM (GAUZE/BANDAGES/DRESSINGS) IMPLANT
DRSG TELFA 3X8 NADH STRL (GAUZE/BANDAGES/DRESSINGS) IMPLANT
GLOVE BIO SURGEON STRL SZ7.5 (GLOVE) ×1 IMPLANT
GLOVE ECLIPSE 7.5 STRL STRAW (GLOVE) ×1 IMPLANT
GOWN STRL REUS W/ TWL LRG LVL3 (GOWN DISPOSABLE) ×2 IMPLANT
KIT BASIN OR (CUSTOM PROCEDURE TRAY) ×1 IMPLANT
KIT TURNOVER KIT B (KITS) ×1 IMPLANT
NDL HYPO 25GX1X1/2 BEV (NEEDLE) IMPLANT
NDL PRECISIONGLIDE 27X1.5 (NEEDLE) ×1 IMPLANT
NDL SPNL 25GX3.5 QUINCKE BL (NEEDLE) ×1 IMPLANT
NEEDLE HYPO 25GX1X1/2 BEV (NEEDLE) ×1 IMPLANT
NEEDLE PRECISIONGLIDE 27X1.5 (NEEDLE) IMPLANT
NEEDLE SPNL 25GX3.5 QUINCKE BL (NEEDLE) ×1 IMPLANT
NS IRRIG 1000ML POUR BTL (IV SOLUTION) ×1 IMPLANT
PAD ARMBOARD POSITIONER FOAM (MISCELLANEOUS) ×2 IMPLANT
PATTIES SURGICAL .5 X3 (DISPOSABLE) ×1 IMPLANT
POSITIONER HEAD DONUT 9IN (MISCELLANEOUS) IMPLANT
SHEATH ENDOSCRUB 0 DEG (SHEATH) ×1 IMPLANT
SHEATH ENDOSCRUB 30 DEG (SHEATH) ×1 IMPLANT
SHEATH ENDOSCRUB 45 DEG (SHEATH) IMPLANT
SOL ANTI FOG 6CC (MISCELLANEOUS) ×1 IMPLANT
SUT ETHILON 3 0 PS 1 (SUTURE) IMPLANT
SYR 50ML SLIP (SYRINGE) IMPLANT
TOWEL GREEN STERILE FF (TOWEL DISPOSABLE) ×1 IMPLANT
TRACKER ENT INSTRUMENT (MISCELLANEOUS) IMPLANT
TRACKER ENT PATIENT (MISCELLANEOUS) IMPLANT
TRAY ENT MC OR (CUSTOM PROCEDURE TRAY) ×1 IMPLANT
TUBE CONNECTING 12X1/4 (SUCTIONS) ×1 IMPLANT
TUBING STRAIGHTSHOT EPS 5PK (TUBING) IMPLANT
WATER STERILE IRR 1000ML POUR (IV SOLUTION) ×1 IMPLANT

## 2023-04-09 NOTE — Anesthesia Procedure Notes (Signed)
 Procedure Name: Intubation Date/Time: 04/09/2023 9:15 AM  Performed by: Floydene Flock, CRNAPre-anesthesia Checklist: Patient identified, Emergency Drugs available, Suction available and Patient being monitored Patient Re-evaluated:Patient Re-evaluated prior to induction Oxygen Delivery Method: Circle system utilized Preoxygenation: Pre-oxygenation with 100% oxygen Induction Type: IV induction Ventilation: Oral airway inserted - appropriate to patient size Laryngoscope Size: Mac and 3 Grade View: Grade I Tube type: Oral Tube size: 7.5 mm Number of attempts: 1 Airway Equipment and Method: Stylet Placement Confirmation: ETT inserted through vocal cords under direct vision, positive ETCO2 and breath sounds checked- equal and bilateral Secured at: 23 cm Tube secured with: Tape Dental Injury: Injury to lip  Comments: Two pin point spots bleeding on the upper lip.

## 2023-04-09 NOTE — Transfer of Care (Signed)
 Immediate Anesthesia Transfer of Care Note  Patient: Charles Mitchell  Procedure(s) Performed: TOTAL ETHMOIDECTOMY (Bilateral: Nose) MAXILLARY ANTROSTOMY WITH TISSUE REMOVAL (Bilateral: Nose) EXPLORATION, FRONTAL SINUS (Bilateral: Nose) SPHENOIDOTOMY WITH TISSUE REMOVAL (Bilateral: Nose) FUSION IMAGE GUIDANCE (Bilateral: Nose)  Patient Location: PACU  Anesthesia Type:General  Level of Consciousness: awake, alert , and oriented  Airway & Oxygen Therapy: Patient Spontanous Breathing and Patient connected to face mask oxygen  Post-op Assessment: Report given to RN and Post -op Vital signs reviewed and stable  Post vital signs: Reviewed and stable  Last Vitals:  Vitals Value Taken Time  BP 139/84 04/09/23 1043  Temp    Pulse 73 04/09/23 1044  Resp 17 04/09/23 1044  SpO2 100 % 04/09/23 1044  Vitals shown include unfiled device data.  Last Pain:  Vitals:   04/09/23 0715  PainSc: 0-No pain         Complications: No notable events documented.

## 2023-04-09 NOTE — Op Note (Signed)
 PREOPERATIVE DIAGNOSIS:  Chronic polypoid pansinusitis   POSTOPERATIVE DIAGNOSIS:  Chronic polypoid pansinusitis   PROCEDURE:  Bilateral total ethmoidectomy, bilateral frontal sinus exploration, bilateral maxillary antrostomy with tissue removal, bilateral sphenoidotomy with tissue removal, Fusion image guidance   SURGEON:  Christia Reading, MD   ANESTHESIA:  General endotracheal anesthesia   COMPLICATIONS:  None   INDICATIONS:  The patient is an 89 year old male with a history of chronic polypoid sinusitis with limited response to medical therapy.  He presents to the operating room for surgical management.   FINDINGS:  Prominent polyps obstructing both nasal passages to nasal vestibules, worse on left, with involvement of all sinuses.   DESCRIPTION OF PROCEDURE:  The patient was identified in the holding room, informed consent having been obtained including discussion of risks, benefits and alternatives, the patient was brought to the operative suite and put the operative table in the supine position.  Anesthesia was induced and the patient was intubated by the anesthesia team without difficulty.  The eyes were lubricated and the Fusion antenna was placed.  The patient was given intravenous antibiotics during the case.  The face was prepped and draped in sterile fashion.  The patient was registered to the Fusion system in the standard fashion.  The eyes were taped closed and Afrin-soaked pledgets were placed in both sides of the nose.  After registering instruments, the right-sided pledgets were removed and the nasal passage was inspected with a straight telescope.  Polyp was removed from the nasal passage using the microdebrider.  The lateral nasal wall was injected with local anesthetic.  The middle turbinate was medialized and the uncinate process and polyp tissue were removed into the ethmoid using the microdebrider.  An angled telescope was used to evaluate the maxillary opening that was widened  posteriorly and inferiorly.  Mild polyp tissue and thick mucus were removed from the maxillary sinus.  The straight telescope was then used to evaluate the ethmoid.  The ethmoid bulla and anterior cells were then removed using the microdebrider.  The basal lamella was penetrated and posterior cells removed.  Image guidance was used to assist.  This was continued to widening the sphenoid opening and removing polyp from within the sinus.  The anterior skull base was then inspected with an angled telescope.  Septations were removed using the microdebrider in a retrograde fashion to the frontal recess.  Image guidance was used.  The frontal recess was then dissected of septations using the microdebrider.  The natural frontal recess was identified and polyp tissue removed leaving a widely patent pathway.  Image guidance was used.  At this point, Afrin-soaked pledgets were placed in the sinuses.  Attention was then directed to the left side where the same procedures were performed including removal of nasal passage polyps, injection of local anesthetic, removal of the uncinate process, widening of the maxillary opening with removal of polyp from the sinus, removal of anterior and posterior ethmoid air cells, widening of the sphenoid opening with removal of polyp tissue, dissection of the anterior skull base, clearing of the frontal recess, and placement of Afrin-soaked pledgets.  Pledgets were then removed and the sinuses inspected.  Half of a Nasopore pack was then placed in each ethmoid sinus coated with mupirocin ointment.  Each pack was saturated with saline.  Nasal passages and the throat were suctioned.   Drapes were removed and the patient was cleaned off.  The patient was returned to anesthesia for wakeup, extubated, and taken to the recovery room  in stable condition.

## 2023-04-09 NOTE — H&P (Signed)
 Charles Mitchell is an 89 y.o. male.   Chief Complaint: Chronic polypoid sinusitis HPI: 89 year old male with long history of nasal obstruction due to nasal polyps and chronic sinusitis.  Past Medical History:  Diagnosis Date   Asthma    BPH (benign prostatic hypertrophy) with urinary retention    Colon cancer (HCC)    documentation refers to rectal ca but pt calls it colon ca   COPD (chronic obstructive pulmonary disease) (HCC)    mgd by pcp  on flovent; per patient able to walk 2 miles per day with minimal dyspnea    Diverticulosis of colon    severe   Dyspnea    Eczema    Foley catheter in place    now removed    GERD (gastroesophageal reflux disease)    History of adenomatous polyp of colon    History of kidney stones    passed stone   History of melanoma excision    skin   History of rectal cancer    01-24-2004  s/p  excision rectal cancer--  no recurrence   Hyperlipidemia    Prostate cancer (HCC)    Skin cancer    Wears hearing aid in both ears     Past Surgical History:  Procedure Laterality Date   COLONOSCOPY  last one 07-06-2009   EYE SURGERY Bilateral    cataracts removed   GREEN LIGHT LASER TURP (TRANSURETHRAL RESECTION OF PROSTATE N/A 04/19/2014   Procedure: GREEN LIGHT LASER TURP (TRANSURETHRAL RESECTION OF PROSTATE;  Surgeon: Jerilee Field, MD;  Location: [facility];  Service: Urology;  Laterality: N/A;   TRANSANAL EXCISION RECTAL CANCER/  HEMORRHOIDECTOMY  01/24/2004   TRANSURETHRAL RESECTION OF PROSTATE N/A 05/28/2018   Procedure: TRANSURETHRAL RESECTION OF THE PROSTATE (TURP);  Surgeon: Jerilee Field, MD;  Location: [facility];  Service: Urology;  Laterality: N/A;    Family History  Problem Relation Age of Onset   Cirrhosis Father    Liver disease Father    Colon cancer Brother    Breast cancer Neg Hx    Pancreatic cancer Neg Hx    Prostate cancer Neg Hx    Social History:  reports that he quit smoking about 40 years ago. His  smoking use included cigarettes. He started smoking about 70 years ago. He has a 30 pack-year smoking history. He has never used smokeless tobacco. He reports that he does not currently use alcohol. He reports that he does not use drugs.  Allergies:  Allergies  Allergen Reactions   Sulfa Antibiotics Other (See Comments)    Unknown childhood reaction    Medications Prior to Admission  Medication Sig Dispense Refill   BREO ELLIPTA 100-25 MCG/ACT AEPB Inhale 1 puff into the lungs daily.     cetirizine (ZYRTEC ALLERGY) 10 MG tablet Take 1 tablet (10 mg total) by mouth daily. 30 tablet 6   Cholecalciferol (VITAMIN D3) 50 MCG (2000 UT) capsule Take 2,000 Units by mouth daily.     ipratropium (ATROVENT) 0.03 % nasal spray Place 2 sprays into both nostrils every twelve hours. (Patient taking differently: Place 2 sprays into both nostrils daily.) 30 mL 12   montelukast (SINGULAIR) 10 MG tablet Take 10 mg by mouth daily.     MYRBETRIQ 50 MG TB24 tablet Take 50 mg by mouth every evening.     pantoprazole (PROTONIX) 40 MG tablet Take 40 mg by mouth daily as needed (for acid reflux/indigestion.).      sildenafil (VIAGRA) 100  MG tablet Take 100 mg by mouth daily as needed for erectile dysfunction.     simvastatin (ZOCOR) 40 MG tablet Take 40 mg by mouth daily.     vitamin B-12 (CYANOCOBALAMIN) 1000 MCG tablet Take 1,000 mcg by mouth daily.     budesonide-formoterol (SYMBICORT) 160-4.5 MCG/ACT inhaler Inhale 2 puffs into the lungs in the morning and at bedtime. (Patient not taking: Reported on 04/04/2023) 1 each 11   denosumab (PROLIA) 60 MG/ML SOSY injection Inject 60 mg into the skin every 6 (six) months.      Results for orders placed or performed during the hospital encounter of 04/09/23 (from the past 48 hours)  CBC per protocol     Status: Abnormal   Collection Time: 04/09/23  7:17 AM  Result Value Ref Range   WBC 5.6 4.0 - 10.5 K/uL   RBC 3.57 (L) 4.22 - 5.81 MIL/uL   Hemoglobin 11.8 (L) 13.0 -  17.0 g/dL   HCT 16.1 (L) 09.6 - 04.5 %   MCV 96.9 80.0 - 100.0 fL   MCH 33.1 26.0 - 34.0 pg   MCHC 34.1 30.0 - 36.0 g/dL   RDW 40.9 81.1 - 91.4 %   Platelets 221 150 - 400 K/uL   nRBC 0.0 0.0 - 0.2 %    Comment: Performed at [facility], [address], [place], [place] [address]   No results found.  Review of Systems  All other systems reviewed and are negative.   Blood pressure 127/67, pulse 65, temperature 98.3 F (36.8 C), resp. rate 18, height 5\' 6"  (1.676 m), weight 54.9 kg, SpO2 97%. Physical Exam Constitutional:      Appearance: Normal appearance. He is normal weight.  HENT:     Head: Normocephalic and atraumatic.     Right Ear: External ear normal.     Left Ear: External ear normal.     Nose: Nose normal.     Mouth/Throat:     Mouth: Mucous membranes are moist.     Pharynx: Oropharynx is clear.  Eyes:     Extraocular Movements: Extraocular movements intact.     Pupils: Pupils are equal, round, and reactive to light.  Cardiovascular:     Rate and Rhythm: Normal rate.  Pulmonary:     Effort: Pulmonary effort is normal.  Skin:    General: Skin is warm and dry.  Neurological:     General: No focal deficit present.     Mental Status: He is alert and oriented to person, place, and time.  Psychiatric:        Mood and Affect: Mood normal.        Behavior: Behavior normal.        Thought Content: Thought content normal.        Judgment: Judgment normal.      Assessment/Plan Chronic polypoid sinusitis  To OR for bilateral endoscopic sinus surgery.  Christia Reading, MD 04/09/2023, 8:27 AM

## 2023-04-09 NOTE — Anesthesia Postprocedure Evaluation (Signed)
 Anesthesia Post Note  Patient: Charles Mitchell  Procedure(s) Performed: TOTAL ETHMOIDECTOMY (Bilateral: Nose) MAXILLARY ANTROSTOMY WITH TISSUE REMOVAL (Bilateral: Nose) EXPLORATION, FRONTAL SINUS (Bilateral: Nose) SPHENOIDOTOMY WITH TISSUE REMOVAL (Bilateral: Nose) FUSION IMAGE GUIDANCE (Bilateral: Nose)     Patient location during evaluation: PACU Anesthesia Type: General Level of consciousness: awake and alert Pain management: pain level controlled Vital Signs Assessment: post-procedure vital signs reviewed and stable Respiratory status: spontaneous breathing, nonlabored ventilation, respiratory function stable and patient connected to nasal cannula oxygen Cardiovascular status: blood pressure returned to baseline and stable Postop Assessment: no apparent nausea or vomiting Anesthetic complications: no  No notable events documented.  Last Vitals:  Vitals:   04/09/23 1100 04/09/23 1115  BP: (!) 143/79 (!) 151/73  Pulse: 73 71  Resp: 17 16  Temp:  36.9 C  SpO2: 92% 93%    Last Pain:  Vitals:   04/09/23 1115  PainSc: 2                  Trevor Iha

## 2023-04-10 ENCOUNTER — Encounter (HOSPITAL_COMMUNITY): Payer: Self-pay | Admitting: Otolaryngology

## 2023-04-10 LAB — SURGICAL PATHOLOGY

## 2023-05-21 ENCOUNTER — Other Ambulatory Visit (HOSPITAL_COMMUNITY): Payer: Self-pay | Admitting: *Deleted

## 2023-05-22 ENCOUNTER — Ambulatory Visit (HOSPITAL_COMMUNITY)
Admission: RE | Admit: 2023-05-22 | Discharge: 2023-05-22 | Disposition: A | Discharge status: Home / Self Care | Source: Ambulatory Visit | Attending: Internal Medicine | Admitting: Internal Medicine

## 2023-05-22 DIAGNOSIS — M810 Age-related osteoporosis without current pathological fracture: Secondary | ICD-10-CM | POA: Insufficient documentation

## 2023-05-22 MED ORDER — DENOSUMAB 60 MG/ML ~~LOC~~ SOSY
60.0000 mg | PREFILLED_SYRINGE | Freq: Once | SUBCUTANEOUS | Status: AC
  Administered 2023-05-22: 60 mg via SUBCUTANEOUS

## 2023-05-22 MED ORDER — DENOSUMAB 60 MG/ML ~~LOC~~ SOSY
PREFILLED_SYRINGE | SUBCUTANEOUS | Status: AC
Start: 2023-05-22 — End: ?
  Filled 2023-05-22: qty 1

## 2023-06-09 ENCOUNTER — Other Ambulatory Visit: Payer: Self-pay

## 2023-06-09 DIAGNOSIS — C61 Malignant neoplasm of prostate: Secondary | ICD-10-CM
